# Patient Record
Sex: Male | Born: 1965 | Race: White | Hispanic: No | Marital: Married | Smoking: Current every day smoker
Health system: Southern US, Community
[De-identification: ages and names within clinical notes are randomized; demographics above are authoritative.]

## PROBLEM LIST (undated history)

## (undated) DIAGNOSIS — Z91018 Allergy to other foods: Secondary | ICD-10-CM

## (undated) DIAGNOSIS — K219 Gastro-esophageal reflux disease without esophagitis: Secondary | ICD-10-CM

## (undated) DIAGNOSIS — Z720 Tobacco use: Secondary | ICD-10-CM

## (undated) DIAGNOSIS — E785 Hyperlipidemia, unspecified: Secondary | ICD-10-CM

## (undated) DIAGNOSIS — E079 Disorder of thyroid, unspecified: Secondary | ICD-10-CM

## (undated) DIAGNOSIS — I10 Essential (primary) hypertension: Secondary | ICD-10-CM

## (undated) HISTORY — PX: ELBOW SURGERY: SHX618

## (undated) HISTORY — PX: CHOLECYSTECTOMY: SHX55

---

## 2000-06-02 ENCOUNTER — Ambulatory Visit (HOSPITAL_BASED_OUTPATIENT_CLINIC_OR_DEPARTMENT_OTHER): Admission: RE | Admit: 2000-06-02 | Discharge: 2000-06-02 | Payer: Self-pay | Admitting: Orthopedic Surgery

## 2005-01-03 ENCOUNTER — Emergency Department (HOSPITAL_COMMUNITY): Admission: EM | Admit: 2005-01-03 | Discharge: 2005-01-04 | Payer: Self-pay | Admitting: Emergency Medicine

## 2008-06-25 ENCOUNTER — Ambulatory Visit: Payer: Self-pay | Admitting: Cardiology

## 2010-10-29 NOTE — Op Note (Signed)
[place]  Patient:    Benjamin Anderson, Benjamin Anderson                      MRN: [id number] Proc. Date: 06/02/00 Adm. Date:  [id number] Disc. Date: [id number] Attending:  Alinda Deem                           Operative Report  PREOPERATIVE DIAGNOSIS:  Loose bodies of the left elbow.  POSTOPERATIVE DIAGNOSIS:  Loose bodies of the left elbow.  Chondromalacia of the olecranon.  PROCEDURE:  Left elbow arthroscopic removal of anterior loose body and debridement of chondromalacia from the olecranon near the radiocapetellar joint.  SURGEON:  Alinda Deem, M.D.  FIRST ASSISTANT:  Dorthula Matas, P.A.-C.  ANESTHESIA:  General endotracheal.  ESTIMATED BLOOD LOSS:  Minimal.  FLUID REPLACEMENT:  800 cc Crystalloid.  DRAINS PLACED:  None.  TOURNIQUET TIME:  ______ .  INDICATIONS FOR PROCEDURE:  A 45 year old man with mechanical catching and pain in his left elbow.  Plain radiographs show a loose body in the elbow anterior compartment.  He has failed conservative treatment and desires elective removal of the loose body that is a work-related injury.  DESCRIPTION OF PROCEDURE:  The patient identified by armband, taken to the operating room at [facility]. [facility]. Appropriate anesthetic monitor attached, general endotracheal anesthesia induced with the patient in the supine position.  He was then rolled into the right lateral decubitus position, tourniquet applied high to the left upper extremity.  He was placed in the elbow holder positioner.  The left upper extremity was then prepped and draped in the usual sterile fashion from the wrist to the elbow holder.  We began the procedure using an anterolateral approach to the elbow joint, and filling the joint with about 20 cc of 0.5% Marcaine with epinephrine solution. A #11 blade was then used to make a standard anterolateral portal, and the 4 mm Lindotek arthroscope  introduced from the anterolateral position.  We immediately identified the loose body, which was hiding in the medial guttering anteriorly; this was teased over the middle of the joint using an 18-gauge  needle from a medial approach.  A standard anteromedial portal was then made, allowing introduction of an arthroscopic grasper; which removed the 2-3 mm loose body without difficulty.  It was an osteochondral _____________. pearl with cartilage coating throughout.  The rest of the anterior compartment was in good condition.  The arthroscope was then repositioned into the posterior fossa using a posterolateral portal and a standard posterior portal.  The posterior olecranon fossa and the posterior aspect of the olecranon were in good condition.  Using a posterolateral portal, we then explored the humeroulnar joint, and did find some chondromalacia of the junction at the radiocapetellar joint (which was removed with a 2.9 mm ____________ posterolateral portal. It was more of a mid lateral portal.  Having accomplished this, the needle was washed out with normal saline solution.  The arthroscopic instrument was removed.  A dressing of Xeroform, 4 x dressing sponges, Webril and Ace wrap applied.  The patient was awakened and taken to the recovery room without difficulty. DD:  06/02/00 TD:  06/08/00 Job: [id number]

## 2015-01-01 ENCOUNTER — Encounter (HOSPITAL_COMMUNITY): Payer: Self-pay | Admitting: Emergency Medicine

## 2015-01-01 ENCOUNTER — Emergency Department (HOSPITAL_COMMUNITY)
Admission: EM | Admit: 2015-01-01 | Discharge: 2015-01-01 | Disposition: A | Discharge status: Home / Self Care | Payer: BLUE CROSS/BLUE SHIELD | Attending: Emergency Medicine | Admitting: Emergency Medicine

## 2015-01-01 ENCOUNTER — Emergency Department (HOSPITAL_COMMUNITY): Payer: BLUE CROSS/BLUE SHIELD

## 2015-01-01 DIAGNOSIS — R079 Chest pain, unspecified: Secondary | ICD-10-CM | POA: Diagnosis not present

## 2015-01-01 DIAGNOSIS — R110 Nausea: Secondary | ICD-10-CM | POA: Insufficient documentation

## 2015-01-01 DIAGNOSIS — K219 Gastro-esophageal reflux disease without esophagitis: Secondary | ICD-10-CM | POA: Diagnosis not present

## 2015-01-01 DIAGNOSIS — R61 Generalized hyperhidrosis: Secondary | ICD-10-CM | POA: Diagnosis not present

## 2015-01-01 DIAGNOSIS — Z720 Tobacco use: Secondary | ICD-10-CM | POA: Insufficient documentation

## 2015-01-01 DIAGNOSIS — I10 Essential (primary) hypertension: Secondary | ICD-10-CM | POA: Insufficient documentation

## 2015-01-01 DIAGNOSIS — E780 Pure hypercholesterolemia: Secondary | ICD-10-CM | POA: Diagnosis not present

## 2015-01-01 HISTORY — DX: Essential (primary) hypertension: I10

## 2015-01-01 LAB — COMPREHENSIVE METABOLIC PANEL
ALBUMIN: 4.2 g/dL (ref 3.5–5.0)
ALT: 34 U/L (ref 17–63)
AST: 26 U/L (ref 15–41)
Alkaline Phosphatase: 63 U/L (ref 38–126)
Anion gap: 8 (ref 5–15)
BUN: 14 mg/dL (ref 6–20)
CO2: 25 mmol/L (ref 22–32)
CREATININE: 1.21 mg/dL (ref 0.61–1.24)
Calcium: 9.3 mg/dL (ref 8.9–10.3)
Chloride: 105 mmol/L (ref 101–111)
GFR calc Af Amer: >60 mL/min (ref >60)
GLUCOSE: 112 mg/dL — AB (ref 65–99)
POTASSIUM: 3.7 mmol/L (ref 3.5–5.1)
Sodium: 138 mmol/L (ref 135–145)
TOTAL PROTEIN: 6.8 g/dL (ref 6.5–8.1)
Total Bilirubin: 1.1 mg/dL (ref 0.3–1.2)

## 2015-01-01 LAB — CBC
HEMATOCRIT: 42.1 % (ref 39.0–52.0)
HEMOGLOBIN: 14.6 g/dL (ref 13.0–17.0)
MCH: 31.7 pg (ref 26.0–34.0)
MCHC: 34.7 g/dL (ref 30.0–36.0)
MCV: 91.5 fL (ref 78.0–100.0)
PLATELETS: 217 10*3/uL (ref 150–400)
RBC: 4.6 MIL/uL (ref 4.22–5.81)
RDW: 13.1 % (ref 11.5–15.5)
WBC: 11.2 10*3/uL — AB (ref 4.0–10.5)

## 2015-01-01 LAB — TROPONIN I: Troponin I: <0.03 ng/mL (ref <0.031)

## 2015-01-01 LAB — PROTIME-INR
INR: 0.97 (ref 0.00–1.49)
Prothrombin Time: 13.1 seconds (ref 11.6–15.2)

## 2015-01-01 LAB — APTT: aPTT: 26 seconds (ref 24–37)

## 2015-01-01 MED ORDER — RANITIDINE HCL 150 MG PO TABS: 150.0000 mg | ORAL_TABLET | Freq: Two times a day (BID) | ORAL | Status: DC

## 2015-01-01 MED ORDER — ASPIRIN 81 MG PO CHEW
324.0000 mg | CHEWABLE_TABLET | Freq: Once | ORAL | Status: AC
  Administered 2015-01-01: 324 mg via ORAL
  Filled 2015-01-01: qty 4

## 2015-01-01 MED ORDER — NITROGLYCERIN 0.4 MG SL SUBL: 0.4000 mg | SUBLINGUAL_TABLET | SUBLINGUAL | Status: DC | PRN

## 2015-01-01 MED ORDER — GI COCKTAIL ~~LOC~~
30.0000 mL | Freq: Once | ORAL | Status: AC
  Administered 2015-01-01: 30 mL via ORAL
  Filled 2015-01-01: qty 30

## 2015-01-01 NOTE — ED Notes (Signed)
MD Miller at bedside. 

## 2015-01-01 NOTE — ED Provider Notes (Signed)
CSN: [id number]     Arrival date & time 01/01/15  1507 History   First MD Initiated Contact with Patient 01/01/15 1517     Chief Complaint  Patient presents with  . Chest Pain     (Consider location/radiation/quality/duration/timing/severity/associated sxs/prior Treatment) HPI Comments: Pt has hx of Htn, Hypercholesterolemia and Tobacco use - states that approximately 3 hours ago he developed a heavy sensation in his chest, with some radiation to the back, there was some increased sweating and nausea, this occurred while he was walking from one room to another at work. He has a history of a normal heart stress test several years ago for the same reason, he has never had a heart catheterization, he is not a diabetic, there is no family history of heart disease. The symptoms gradually improved though they did not go away by the time he got to the hospital. Since being at the hospital he has had a resurgence in the symptoms he points to the midsternal area, denies arm or neck symptoms, denies swelling of the legs, denies risk factors for pulmonary embolism. He does have acid reflux and states that this is distinct and different. Symptoms are mild at this time.  Patient is a 49 y.o. male presenting with chest pain. The history is provided by the patient.  Chest Pain   Past Medical History  Diagnosis Date  . Hypertension   . High cholesterol    Past Surgical History  Procedure Laterality Date  . Elbow surgery     History reviewed. No pertinent family history. History  Substance Use Topics  . Smoking status: Current Every Day Smoker  . Smokeless tobacco: Not on file  . Alcohol Use: No    Review of Systems  Cardiovascular: Positive for chest pain.  All other systems reviewed and are negative.     Allergies  Review of patient's allergies indicates no known allergies.  Home Medications   Prior to Admission medications   Medication Sig Start Date End Date Taking? Authorizing  Provider  atorvastatin (LIPITOR) 10 MG tablet Take 1 tablet by mouth at bedtime.  12/11/14  Yes Historical Provider, MD  calcium carbonate (TUMS - DOSED IN MG ELEMENTAL CALCIUM) 500 MG chewable tablet Chew 1 tablet by mouth daily as needed for indigestion or heartburn.   Yes Historical Provider, MD  metoprolol (LOPRESSOR) 50 MG tablet Take 25 mg by mouth at bedtime.  10/13/14  Yes Historical Provider, MD  ranitidine (ZANTAC) 150 MG tablet Take 1 tablet (150 mg total) by mouth 2 (two) times daily. 01/01/15   Eber Hong, MD   BP 116/83 mmHg  Pulse 71  Temp(Src) 98.1 F (36.7 C)  Resp 20  Ht 5\' 11"  (1.803 m)  Wt 245 lb (111.131 kg)  BMI 34.19 kg/m2  SpO2 99% Physical Exam  Constitutional: He appears well-developed and well-nourished. No distress.  HENT:  Head: Normocephalic and atraumatic.  Mouth/Throat: Oropharynx is clear and moist. No oropharyngeal exudate.  Eyes: Conjunctivae and EOM are normal. Pupils are equal, round, and reactive to light. Right eye exhibits no discharge. Left eye exhibits no discharge. No scleral icterus.  Neck: Normal range of motion. Neck supple. No JVD present. No thyromegaly present.  Cardiovascular: Normal rate, regular rhythm, normal heart sounds and intact distal pulses.  Exam reveals no gallop and no friction rub.   No murmur heard. Pulmonary/Chest: Effort normal and breath sounds normal. No respiratory distress. He has no wheezes. He has no rales.  Abdominal: Soft. Bowel sounds  are normal. He exhibits no distension and no mass. There is no tenderness.  Musculoskeletal: Normal range of motion. He exhibits no edema or tenderness.  Lymphadenopathy:    He has no cervical adenopathy.  Neurological: He is alert. Coordination normal.  Skin: Skin is warm and dry. No rash noted. No erythema.  Psychiatric: He has a normal mood and affect. His behavior is normal.  Nursing note and vitals reviewed.   ED Course  Procedures (including critical care time) Labs  Review Labs Reviewed  CBC - Abnormal; Notable for the following:    WBC 11.2 (*)    All other components within normal limits  COMPREHENSIVE METABOLIC PANEL - Abnormal; Notable for the following:    Glucose, Bld 112 (*)    All other components within normal limits  APTT  PROTIME-INR  TROPONIN I    Imaging Review Dg Chest Portable 1 View  01/01/2015   CLINICAL DATA:  Chest pain since launch, history of tobacco use  EXAM: PORTABLE CHEST - 1 VIEW  COMPARISON:  None.  FINDINGS: The heart size and mediastinal contours are within normal limits. Both lungs are clear. The visualized skeletal structures are unremarkable.  IMPRESSION: No active disease.   Electronically Signed   By: Alcide Clever M.D.   On: 01/01/2015 15:56    ED ECG REPORT  I personally interpreted this EKG   Date: 01/01/2015   Rate: 82  Rhythm: normal sinus rhythm  QRS Axis: normal  Intervals: normal  ST/T Wave abnormalities: normal  Conduction Disutrbances:none  Narrative Interpretation:   Old EKG Reviewed: none available   MDM   Final diagnoses:  Chest pain, unspecified chest pain type    At this time the patient has a normal exam with an EKG which is unremarkable, heart sounds are normal and no peripheral edema. His story is somewhat concerning as he does have some risk factors for heart disease.  His heart score is 4  The labs are normal - his pain totally resolved after GI cocktail- his VS are normal as was the ECG - the pt declines admission for r/o - we spoke at length about the indications for return, and the need for close f/u -  will add zantac.  Will also d/w cardiology to establish f/u.  D/w Cardiology - they will set up appt for early next week, pt in agreement.  I have personally viewed and interpreted the imaging and agree with radiologist interpretation. - no signs of infection / pneumothorax or abnormal mediastinum    Eber Hong, MD 01/01/15 [phone]

## 2015-01-01 NOTE — Discharge Instructions (Signed)
The cardiology office will call you to set up an appointment early next week - take a daily baby Aspirin until that time.  Please obtain all of your results from medical records or have your doctors office obtain the results - share them with your doctor - you should be seen at your doctors office in the next 2 days. Call today to arrange your follow up. Take the medications as prescribed. Please review all of the medicines and only take them if you do not have an allergy to them. Please be aware that if you are taking birth control pills, taking other prescriptions, ESPECIALLY ANTIBIOTICS may make the birth control ineffective - if this is the case, either do not engage in sexual activity or use alternative methods of birth control such as condoms until you have finished the medicine and your family doctor says it is OK to restart them. If you are on a blood thinner such as COUMADIN, be aware that any other medicine that you take may cause the coumadin to either work too much, or not enough - you should have your coumadin level rechecked in next 7 days if this is the case.  ?  It is also a possibility that you have an allergic reaction to any of the medicines that you have been prescribed - Everybody reacts differently to medications and while MOST people have no trouble with most medicines, you may have a reaction such as nausea, vomiting, rash, swelling, shortness of breath. If this is the case, please stop taking the medicine immediately and contact your physician.  ?  You should return to the ER if you develop severe or worsening symptoms.

## 2015-01-01 NOTE — ED Notes (Signed)
Central cp radiating to back with n/sweating since noon today.

## 2015-01-06 ENCOUNTER — Encounter: Payer: BLUE CROSS/BLUE SHIELD | Admitting: Cardiology

## 2015-01-19 ENCOUNTER — Emergency Department (HOSPITAL_COMMUNITY): Payer: BLUE CROSS/BLUE SHIELD

## 2015-01-19 ENCOUNTER — Encounter (HOSPITAL_COMMUNITY): Payer: Self-pay

## 2015-01-19 ENCOUNTER — Other Ambulatory Visit: Payer: Self-pay

## 2015-01-19 ENCOUNTER — Observation Stay (HOSPITAL_COMMUNITY)
Admission: EM | Admit: 2015-01-19 | Discharge: 2015-01-20 | Disposition: A | Discharge status: Home / Self Care | Payer: BLUE CROSS/BLUE SHIELD | Attending: Interventional Cardiology | Admitting: Interventional Cardiology

## 2015-01-19 DIAGNOSIS — E780 Pure hypercholesterolemia: Secondary | ICD-10-CM | POA: Insufficient documentation

## 2015-01-19 DIAGNOSIS — I200 Unstable angina: Secondary | ICD-10-CM | POA: Diagnosis present

## 2015-01-19 DIAGNOSIS — K219 Gastro-esophageal reflux disease without esophagitis: Secondary | ICD-10-CM | POA: Insufficient documentation

## 2015-01-19 DIAGNOSIS — E785 Hyperlipidemia, unspecified: Secondary | ICD-10-CM | POA: Diagnosis not present

## 2015-01-19 DIAGNOSIS — Z79899 Other long term (current) drug therapy: Secondary | ICD-10-CM | POA: Diagnosis not present

## 2015-01-19 DIAGNOSIS — Z8249 Family history of ischemic heart disease and other diseases of the circulatory system: Secondary | ICD-10-CM | POA: Diagnosis not present

## 2015-01-19 DIAGNOSIS — R0789 Other chest pain: Secondary | ICD-10-CM | POA: Diagnosis not present

## 2015-01-19 DIAGNOSIS — Z720 Tobacco use: Secondary | ICD-10-CM

## 2015-01-19 DIAGNOSIS — I10 Essential (primary) hypertension: Secondary | ICD-10-CM

## 2015-01-19 DIAGNOSIS — R079 Chest pain, unspecified: Secondary | ICD-10-CM | POA: Diagnosis present

## 2015-01-19 DIAGNOSIS — F17200 Nicotine dependence, unspecified, uncomplicated: Secondary | ICD-10-CM | POA: Insufficient documentation

## 2015-01-19 DIAGNOSIS — R21 Rash and other nonspecific skin eruption: Secondary | ICD-10-CM | POA: Insufficient documentation

## 2015-01-19 HISTORY — DX: Hyperlipidemia, unspecified: E78.5

## 2015-01-19 HISTORY — DX: Tobacco use: Z72.0

## 2015-01-19 HISTORY — DX: Gastro-esophageal reflux disease without esophagitis: K21.9

## 2015-01-19 LAB — COMPREHENSIVE METABOLIC PANEL WITH GFR
ALT: 24 U/L (ref 17–63)
AST: 22 U/L (ref 15–41)
Albumin: 3.8 g/dL (ref 3.5–5.0)
Alkaline Phosphatase: 61 U/L (ref 38–126)
Anion gap: 8 (ref 5–15)
BUN: 8 mg/dL (ref 6–20)
CO2: 23 mmol/L (ref 22–32)
Calcium: 9.3 mg/dL (ref 8.9–10.3)
Chloride: 108 mmol/L (ref 101–111)
Creatinine, Ser: 1.07 mg/dL (ref 0.61–1.24)
GFR calc Af Amer: >60 mL/min
GFR calc non Af Amer: >60 mL/min
Glucose, Bld: 113 mg/dL — ABNORMAL HIGH (ref 65–99)
Potassium: 4.1 mmol/L (ref 3.5–5.1)
Sodium: 139 mmol/L (ref 135–145)
Total Bilirubin: 0.6 mg/dL (ref 0.3–1.2)
Total Protein: 6.1 g/dL — ABNORMAL LOW (ref 6.5–8.1)

## 2015-01-19 LAB — CBC
HCT: 43.4 % (ref 39.0–52.0)
Hemoglobin: 14.9 g/dL (ref 13.0–17.0)
MCH: 32.3 pg (ref 26.0–34.0)
MCHC: 34.3 g/dL (ref 30.0–36.0)
MCV: 93.9 fL (ref 78.0–100.0)
PLATELETS: 183 10*3/uL (ref 150–400)
RBC: 4.62 MIL/uL (ref 4.22–5.81)
RDW: 13.6 % (ref 11.5–15.5)
WBC: 9.6 10*3/uL (ref 4.0–10.5)

## 2015-01-19 LAB — PROTIME-INR
INR: 1.02 (ref 0.00–1.49)
Prothrombin Time: 13.6 seconds (ref 11.6–15.2)

## 2015-01-19 LAB — I-STAT TROPONIN, ED
Troponin i, poc: 0 ng/mL (ref 0.00–0.08)
Troponin i, poc: 0 ng/mL (ref 0.00–0.08)

## 2015-01-19 LAB — APTT: aPTT: 27 s (ref 24–37)

## 2015-01-19 LAB — TROPONIN I: Troponin I: <0.03 ng/mL (ref <0.031)

## 2015-01-19 MED ORDER — METOPROLOL TARTRATE 25 MG PO TABS
25.0000 mg | ORAL_TABLET | Freq: Every day | ORAL | Status: DC
  Administered 2015-01-19: 21:00:00 25 mg via ORAL
  Filled 2015-01-19: qty 1

## 2015-01-19 MED ORDER — SODIUM CHLORIDE 0.9 % IV SOLN
1000.0000 mL | INTRAVENOUS | Status: DC
  Administered 2015-01-19: 1000 mL via INTRAVENOUS

## 2015-01-19 MED ORDER — SODIUM CHLORIDE 0.9 % IJ SOLN: 3.0000 mL | INTRAMUSCULAR | Status: DC | PRN

## 2015-01-19 MED ORDER — ATORVASTATIN CALCIUM 10 MG PO TABS: 10.0000 mg | ORAL_TABLET | Freq: Every day | ORAL | Status: DC

## 2015-01-19 MED ORDER — NITROGLYCERIN 2 % TD OINT
1.0000 [in_us] | TOPICAL_OINTMENT | Freq: Once | TRANSDERMAL | Status: AC
  Administered 2015-01-19: 1 [in_us] via TOPICAL
  Filled 2015-01-19: qty 1

## 2015-01-19 MED ORDER — DIPHENHYDRAMINE HCL 25 MG PO CAPS
25.0000 mg | ORAL_CAPSULE | ORAL | Status: AC
  Administered 2015-01-19: 25 mg via ORAL
  Filled 2015-01-19: qty 1

## 2015-01-19 MED ORDER — SODIUM CHLORIDE 0.9 % IV SOLN: 250.0000 mL | INTRAVENOUS | Status: DC | PRN

## 2015-01-19 MED ORDER — DIPHENHYDRAMINE HCL 25 MG PO CAPS
25.0000 mg | ORAL_CAPSULE | Freq: Four times a day (QID) | ORAL | Status: DC | PRN
  Administered 2015-01-19 – 2015-01-20 (×4): 25 mg via ORAL
  Filled 2015-01-19 (×4): qty 1

## 2015-01-19 MED ORDER — ASPIRIN EC 81 MG PO TBEC
81.0000 mg | DELAYED_RELEASE_TABLET | Freq: Every day | ORAL | Status: DC
  Administered 2015-01-20: 11:00:00 81 mg via ORAL
  Filled 2015-01-19: qty 1

## 2015-01-19 MED ORDER — SODIUM CHLORIDE 0.9 % IJ SOLN
3.0000 mL | Freq: Two times a day (BID) | INTRAMUSCULAR | Status: DC
  Administered 2015-01-20: 3 mL via INTRAVENOUS

## 2015-01-19 MED ORDER — ASPIRIN 81 MG PO CHEW: 81.0000 mg | CHEWABLE_TABLET | ORAL | Status: DC

## 2015-01-19 MED ORDER — SODIUM CHLORIDE 0.9 % WEIGHT BASED INFUSION: 3.0000 mL/kg/h | INTRAVENOUS | Status: DC

## 2015-01-19 MED ORDER — HEPARIN (PORCINE) IN NACL 100-0.45 UNIT/ML-% IJ SOLN
1500.0000 [IU]/h | INTRAMUSCULAR | Status: DC
  Administered 2015-01-19: 1200 [IU]/h via INTRAVENOUS
  Filled 2015-01-19 (×2): qty 250

## 2015-01-19 MED ORDER — ONDANSETRON HCL 4 MG/2ML IJ SOLN
4.0000 mg | Freq: Four times a day (QID) | INTRAMUSCULAR | Status: DC | PRN
  Administered 2015-01-19: 22:00:00 4 mg via INTRAVENOUS

## 2015-01-19 MED ORDER — HEPARIN BOLUS VIA INFUSION
4000.0000 [IU] | Freq: Once | INTRAVENOUS | Status: AC
  Administered 2015-01-19: 4000 [IU] via INTRAVENOUS
  Filled 2015-01-19: qty 4000

## 2015-01-19 MED ORDER — ONDANSETRON HCL 4 MG/2ML IJ SOLN
INTRAMUSCULAR | Status: AC
  Filled 2015-01-19: qty 2

## 2015-01-19 MED ORDER — ACETAMINOPHEN 325 MG PO TABS
650.0000 mg | ORAL_TABLET | Freq: Four times a day (QID) | ORAL | Status: DC | PRN
  Administered 2015-01-19: 21:00:00 650 mg via ORAL
  Filled 2015-01-19: qty 2

## 2015-01-19 MED ORDER — SODIUM CHLORIDE 0.9 % IJ SOLN: 3.0000 mL | Freq: Two times a day (BID) | INTRAMUSCULAR | Status: DC

## 2015-01-19 MED ORDER — SODIUM CHLORIDE 0.9 % WEIGHT BASED INFUSION
1.0000 mL/kg/h | INTRAVENOUS | Status: DC
  Administered 2015-01-20: 07:00:00 1 mL/kg/h via INTRAVENOUS

## 2015-01-19 MED ORDER — PANTOPRAZOLE SODIUM 40 MG PO TBEC
40.0000 mg | DELAYED_RELEASE_TABLET | Freq: Every day | ORAL | Status: DC
  Administered 2015-01-20: 40 mg via ORAL
  Filled 2015-01-19: qty 1

## 2015-01-19 MED ORDER — ASPIRIN 81 MG PO CHEW: 324.0000 mg | CHEWABLE_TABLET | Freq: Once | ORAL | Status: DC

## 2015-01-19 NOTE — H&P (Signed)
Patient ID: LARWENCE TU MRN: [id number] DOB/AGE: 47-Jan-1967 47 y.o.  Admit date: 01/19/2015 Referring PhysicianJon Lynelle Doctor, MD Primary PhysicianNo primary care provider on file. Primary Cardiologist: New Reason for Consultation: chest pain  HPI: Mr. Calvillo is a 49 yo male with PMHx of HTN, HLD, GERD and tobacco abuse who presents to the ED on 01/19/15 with complaint of chest pain. Patient developed acute onset of left sided chest pain this morning at work as he was sitting at his desk. Pain was severe, stabbing, 10/10 with radiation to his back and left jaw and associated with diaphoresis, nausea and hives. He took 4 aspirin with resolution of his chest pain after 3-4 minutes. Pain was worse with deep breath. These episodes tend to happen every other day and last for a few minutes at a time, but this episode was the most severe. He admits to chronic DOE, but denies previous cardiac history, orthopnea or lower extremity swelling. He does admit to a family history of cardiac disease. Patient has 31 pack year history, denies alcohol or illicit drug use. Patient denies history of anxiety. He has never had an echo or cardiac cath.   Patient was recently seen in the ED on 01/01/15 with similar complaints of chest pain that resolved after a GI cocktail. EKG and troponin were normal at that time. Patient was discharged from the ED on Zantac and with follow up with Cardiology; however, patient did not follow up.   Review of systems complete and found to be negative unless listed above   Past Medical History  Diagnosis Date  . Hypertension   . High cholesterol   . Acid reflux     History reviewed. No pertinent family history.  History   Social History  . Marital Status: Married    Spouse Name: N/A  . Number of Children: N/A  . Years of Education: N/A   Occupational History  . Not on file.   Social History Main Topics  . Smoking status: Current Every Day Smoker  . Smokeless tobacco: Not on  file  . Alcohol Use: No  . Drug Use: No  . Sexual Activity: Not on file   Other Topics Concern  . Not on file   Social History Narrative    Past Surgical History  Procedure Laterality Date  . Elbow surgery       Physical Exam: Filed Vitals:   01/19/15 1330 01/19/15 1400 01/19/15 1430 01/19/15 1445  BP: 112/78 120/81 108/76 131/77  Pulse: 56 55 59 65  Temp:      TempSrc:      Resp: Height:      Weight:      SpO2: 98% 98% 99% 100%   General: Vital signs reviewed.  Patient is well-developed and well-nourished, in no acute distress and cooperative with exam.  Cardiovascular: RRR, S1 normal, S2 normal, no murmurs, gallops, or rubs. Pain not reproducible with palpation.  Pulmonary/Chest: Clear to auscultation bilaterally, no wheezes, rales, or rhonchi. Abdominal: Soft, non-tender, non-distended, BS + Extremities: No lower extremity edema bilaterally, pulses symmetric and intact bilaterally. Neurological: A&O x3 Skin: Raised wheals on anterior superior chest and shoulders. Psychiatric: Normal mood and affect. speech and behavior is normal. Cognition and memory are normal.   Labs:   Lab Results  Component Value Date   WBC 9.6 01/19/2015   HGB 14.9 01/19/2015   HCT 43.4 01/19/2015   MCV 93.9 01/19/2015   PLT 183 01/19/2015  Recent Labs Lab 01/19/15 1212  NA 139  K 4.1  CL 108  CO2 23  BUN 8  CREATININE 1.07  CALCIUM 9.3  PROT 6.1*  BILITOT 0.6  ALKPHOS 61  ALT 24  AST 22  GLUCOSE 113*   EKG: NSR  ASSESSMENT AND PLAN:  Active Problems:   * No active hospital problems. *  Unstable Angina: Chest pain sounds typical in nature and is concerning for unstable angina given frequent occurences at rest. Heart score is 5 with significant risk factors of tobacco use, age, hyperlipidemia, hypertension, and family history. EKG does not show acute ischemia and troponin negative. CXR negative for acute cardiopulmonary disease. Given typical symptoms, risk  factors, and worsening symptoms over the past 2 weeks, we recommend admission for cardiac catheterization tomorrow morning. Patient took ASA 325 mg this morning.  -Admit to telemetry -Heparin per pharmacy -NPO at midnight -Cardiac cath tomorrow -Trend troponins -Repeat EKG tomorrow morning  -Continue ASA/metoprolol/statin  HTN: Well controlled at 124/80 on metoprolol 25 mg QHS at home.  -Continue metoprolol 25 mg QHS   Hyperlipidemia: Unknown lipid panel results, but patient is on atorvastatin 10 mg daily at home.  -Continue atorvastatin 10 mg daily, may need increase -Repeat lipid panel as outpatient  Signed: Jill Alexanders, DO PGY-2 Internal Medicine Resident Pager # [phone] 01/19/2015 3:34 PM   I have examined the patient and reviewed assessment and plan and discussed with patient.  Agree with above as stated.  Patient with chest discomfort which is increasing in frequency and severity. He has multiple risk factors for CAD. We discussed ischemia evaluation with both stress testing and cardiac catheterization. Given that his symptoms are getting more frequent and they are somewhat typical in nature, we'll plan on cardiac catheterization. The risks and benefits of the procedure were explained to the patient and his family. All questions were answered. Start IV heparin tonight. Continue rule out for MI. He'll be on the cath Schedule for tomorrow.  The patient understands that risks include but are not limited to stroke (1 in 1000), death (1 in 1000), kidney failure [usually temporary] (1 in 500), bleeding (1 in 200), allergic reaction [possibly serious] (1 in 200), and agrees to proceed.    Paighton Godette S.

## 2015-01-19 NOTE — ED Notes (Signed)
Phlebotomy at the bedside  

## 2015-01-19 NOTE — ED Provider Notes (Signed)
CSN: [id number]     Arrival date & time 01/19/15  1141 History   First MD Initiated Contact with Patient 01/19/15 1141     Chief Complaint  Patient presents with  . Chest Pain   HPI Patient presents to the emergency room for evaluation of chest pain. The patient has been having pain off and on for the last couple of weeks. The episodes last maybe 5-10 minutes in duration. The patient notices a discomfort in the center of his chest and sometimes it radiates to his back and his jaw. Today he was at work today when he had another episode that was severe. Nauseated and diaphoretic. He did not have any shortness of breath. He went to the nurse at his place of work and was sent to the emergency department. Patient does not have a history of heart disease. He does have history of hypertension and hypercholesterolemia and he smokes. He was actually seen in the emergency room on July 21. Admission was discussed with the patient did not want to be admitted to the hospital.  Arrangements were made for outpatient cardiology follow-up. The patient did not go to that appointment. Past Medical History  Diagnosis Date  . Hypertension   . High cholesterol   . Acid reflux    Past Surgical History  Procedure Laterality Date  . Elbow surgery     History reviewed. No pertinent family history. History  Substance Use Topics  . Smoking status: Current Every Day Smoker  . Smokeless tobacco: Not on file  . Alcohol Use: No    Review of Systems  All other systems reviewed and are negative.     Allergies  Other  Home Medications   Prior to Admission medications   Medication Sig Start Date End Date Taking? Authorizing Provider  atorvastatin (LIPITOR) 10 MG tablet Take 1 tablet by mouth at bedtime.  12/11/14  Yes Historical Provider, MD  calcium carbonate (TUMS - DOSED IN MG ELEMENTAL CALCIUM) 500 MG chewable tablet Chew 1 tablet by mouth daily as needed for indigestion or heartburn.   Yes Historical  Provider, MD  metoprolol (LOPRESSOR) 50 MG tablet Take 25 mg by mouth at bedtime.  10/13/14  Yes Historical Provider, MD  pantoprazole (PROTONIX) 40 MG tablet Take 40 mg by mouth daily.   Yes Historical Provider, MD  ranitidine (ZANTAC) 150 MG tablet Take 1 tablet (150 mg total) by mouth 2 (two) times daily. Patient not taking: Reported on 01/19/2015 01/01/15   Eber Hong, MD   BP 125/80 mmHg  Pulse 59  Temp(Src) 98 F (36.7 C) (Oral)  Resp 13  Ht  (1.803 m)  Wt 243 lb (110.224 kg)  BMI 33.91 kg/m2  SpO2 99% Physical Exam  Constitutional: He appears well-developed and well-nourished. No distress.  HENT:  Head: Normocephalic and atraumatic.  Right Ear: External ear normal.  Left Ear: External ear normal.  Eyes: Conjunctivae are normal. Right eye exhibits no discharge. Left eye exhibits no discharge. No scleral icterus.  Neck: Neck supple. No tracheal deviation present.  Cardiovascular: Normal rate, regular rhythm and intact distal pulses.   Pulmonary/Chest: Effort normal and breath sounds normal. No stridor. No respiratory distress. He has no wheezes. He has no rales.  Abdominal: Soft. Bowel sounds are normal. He exhibits no distension. There is no tenderness. There is no rebound and no guarding.  Musculoskeletal: He exhibits no edema or tenderness.  Neurological: He is alert. He has normal strength. No cranial nerve deficit (no facial  droop, extraocular movements intact, no slurred speech) or sensory deficit. He exhibits normal muscle tone. He displays no seizure activity. Coordination normal.  Skin: Skin is warm and dry. No rash noted.  Psychiatric: He has a normal mood and affect.  Nursing note and vitals reviewed.   ED Course  Procedures (including critical care time) Labs Review Labs Reviewed  COMPREHENSIVE METABOLIC PANEL - Abnormal; Notable for the following:    Glucose, Bld 113 (*)    Total Protein 6.1 (*)    All other components within normal limits  APTT  CBC   PROTIME-INR  I-STAT TROPOININ, ED    Imaging Review Dg Chest 2 View  01/19/2015   CLINICAL DATA:  Chest pain  EXAM: CHEST  2 VIEW  COMPARISON:  01/01/2015  FINDINGS: Cardiomediastinal silhouette is unremarkable. No acute infiltrate or pleural effusion. No pulmonary edema. Bony thorax is unremarkable.  IMPRESSION: No active cardiopulmonary disease.   Electronically Signed   By: Natasha Mead M.D.   On: 01/19/2015 12:32     EKG Interpretation   Date/Time:  Monday January 19 2015 11:40:24 EDT Ventricular Rate:  64 PR Interval:  164 QRS Duration: 106 QT Interval:  412 QTC Calculation: 425 R Axis:   64 Text Interpretation:  Normal sinus rhythm Normal ECG No significant change  since last tracing Confirmed by Lilyrose Tanney  MD-J, Tysean [place] ([id number]) on 01/19/2015  12:12:20 PM     Medications  0.9 %  sodium chloride infusion (1,000 mLs Intravenous New Bag/Given 01/19/15 1228)  nitroGLYCERIN (NITROGLYN) 2 % ointment 1 inch (1 inch Topical Given 01/19/15 1223)    MDM   Final diagnoses:  Chest pain, unspecified chest pain type   Patient's EKG and cardiac enzymes are reassuring.  The patient's symptoms however are concerning for unstable angina. Heart score is 5.  Patient was supposed to follow-up with the cardiologist failed to make that appointment. I will consult with cardiology see if we can expedite further evaluation vs admit for observation, further testing.  Linwood Dibbles, MD 01/19/15 [phone]

## 2015-01-19 NOTE — ED Notes (Signed)
Cardiology at the bedside.

## 2015-01-19 NOTE — ED Notes (Signed)
Meal tray at the bedside

## 2015-01-19 NOTE — ED Notes (Signed)
Per EMS, Patient is coming from [facility] in [place]. Patient has had a HX of Central chest pain for two weeks. Today, patient reports the central chest pain radiating to his back becoming increasingly worse with 10/10 chest pain. Patient was given 324 mg of Aspirin at facility with relief in chest and decrease of back pain to 2/10. Patient denies SOB, Dizziness, Lighteheadedness, or Nausea. Patient is alert and oriented x4 upon arrival. 20 Gauge in Left AC. 155 CBG, 126/54, 65 HR, 18 RR, 98% on 2 L. HX of HTN, High Cholesterol, and Acid Reflux.

## 2015-01-19 NOTE — Progress Notes (Signed)
ANTICOAGULATION CONSULT NOTE - Initial Consult  Pharmacy Consult for heparin Indication: chest pain/ACS  Allergies  Allergen Reactions  . Other Hives and Itching    tomatoe    Patient Measurements: Height:  (180.3 cm) Weight: 243 lb (110.224 kg) IBW/kg (Calculated) : 75.3 Heparin Dosing Weight: 99 kg  Vital Signs: Temp: 98 F (36.7 C) (08/08 1150) Temp Source: Oral (08/08 1150) BP: 122/76 mmHg (08/08 1700) Pulse Rate: 70 (08/08 1700)  Labs:  Recent Labs  01/19/15 1212  HGB 14.9  HCT 43.4  PLT 183  APTT 27  LABPROT 13.6  INR 1.02  CREATININE 1.07    Estimated Creatinine Clearance: 107.8 mL/min (by C-G formula based on Cr of 1.07).   Medical History: Past Medical History  Diagnosis Date  . Hypertension   . High cholesterol   . Acid reflux     Medications:  Scheduled:    Assessment: 49 yo male presents with chest pain that has been occuring on and off over the last couple of weeks. Was in ED 7/21 for chest pain.  Goal of Therapy:  Heparin level 0.3-0.7 units/ml Monitor platelets by anticoagulation protocol: Yes   Plan:  - Heparin bolus 4000 units, then heparin gtt 1200 units/hr - HL 0000 - Monitor daily CBC, HL, s/sx of bleeding  Annamary Carolin 01/19/2015,5:28 PM

## 2015-01-19 NOTE — ED Notes (Signed)
Paged Admitting provider.

## 2015-01-19 NOTE — Consult Note (Signed)
CARDIOLOGY CONSULT NOTE      Patient ID: Benjamin Anderson MRN: [id number] DOB/AGE: 1965/08/13 49 y.o.  Admit date: 01/19/2015 Referring PhysicianJon Lynelle Doctor, MD Primary PhysicianNo primary care provider on file. Primary Cardiologist: None Reason for Consultation: chest pain  HPI: [name] is a 49 yo male with PMHx of HTN, HLD, GERD and tobacco abuse who presents to the ED on 01/19/15 with complaint of chest pain. Patient developed acute onset of left sided chest pain this morning at work as he was sitting at his desk. Pain was severe, stabbing, 10/10 with radiation to his back and left jaw and associated with diaphoresis, nausea and hives. He took 4 aspirin with resolution of his chest pain after 3-4 minutes. Pain was worse with deep breath. These episodes tend to happen every other day and last for a few minutes at a time, but this episode was the most severe. He admits to chronic DOE, but denies previous cardiac history, orthopnea or lower extremity swelling. He does admit to a family history of cardiac disease. Patient has 31 pack year history, denies alcohol or illicit drug use. Patient denies history of anxiety. He has never had an echo or cardiac cath.   Patient was recently seen in the ED on 01/01/15 with similar complaints of chest pain that resolved after a GI cocktail. EKG and troponin were normal at that time. Patient was discharged from the ED on Zantac and with follow up with Cardiology; however, patient did not follow up.   Review of systems complete and found to be negative unless listed above   Past Medical History  Diagnosis Date  . Hypertension   . High cholesterol   . Acid reflux     History reviewed. No pertinent family history.  History   Social History  . Marital Status: Married    Spouse Name: N/A  . Number of Children: N/A  . Years of Education: N/A   Occupational History  . Not on file.   Social History Main Topics  . Smoking status: Current Every Day Smoker    . Smokeless tobacco: Not on file  . Alcohol Use: No  . Drug Use: No  . Sexual Activity: Not on file   Other Topics Concern  . Not on file   Social History Narrative    Past Surgical History  Procedure Laterality Date  . Elbow surgery       Physical Exam: Filed Vitals:   01/19/15 1330 01/19/15 1400 01/19/15 1430 01/19/15 1445  BP: 112/78 120/81 108/76 131/77  Pulse: 56 55 59 65  Temp:      TempSrc:      Resp: 14 12 15 18   Height:      Weight:      SpO2: 98% 98% 99% 100%   General: Vital signs reviewed.  Patient is well-developed and well-nourished, in no acute distress and cooperative with exam.  Cardiovascular: RRR, S1 normal, S2 normal, no murmurs, gallops, or rubs. Pain not reproducible with palpation.  Pulmonary/Chest: Clear to auscultation bilaterally, no wheezes, rales, or rhonchi. Abdominal: Soft, non-tender, non-distended, BS + Extremities: No lower extremity edema bilaterally, pulses symmetric and intact bilaterally. Neurological: A&O x3 Skin: Raised wheals on anterior superior chest and shoulders. Psychiatric: Normal mood and affect. speech and behavior is normal. Cognition and memory are normal.   Labs:   Lab Results  Component Value Date   WBC 9.6 01/19/2015   HGB 14.9 01/19/2015   HCT 43.4 01/19/2015   MCV 93.9 01/19/2015  PLT 183 01/19/2015     Recent Labs Lab 01/19/15 1212  NA 139  K 4.1  CL 108  CO2 23  BUN 8  CREATININE 1.07  CALCIUM 9.3  PROT 6.1*  BILITOT 0.6  ALKPHOS 61  ALT 24  AST 22  GLUCOSE 113*   EKG: NSR  ASSESSMENT AND PLAN:  Active Problems:   * No active hospital problems. *  Unstable Angina: Chest pain sounds typical in nature and is concerning for unstable angina given frequent occurences at rest. Heart score is 5 with significant risk factors of tobacco use, age, hyperlipidemia, hypertension, and family history. EKG does not show acute ischemia and troponin negative. CXR negative for acute cardiopulmonary disease.  Given typical symptoms, risk factors, and worsening symptoms over the past 2 weeks, we recommend admission for cardiac catheterization tomorrow morning. Patient took ASA 325 mg this morning.  -Admit to telemetry -Heparin per pharmacy -NPO at midnight -Cardiac cath tomorrow -Trend troponins -Repeat EKG tomorrow morning  -Continue ASA/metoprolol/statin  HTN: Well controlled at 124/80 on metoprolol 25 mg QHS at home.  -Continue metoprolol 25 mg QHS   Hyperlipidemia: Unknown lipid panel results, but patient is on atorvastatin 10 mg daily at home.  -Continue atorvastatin 10 mg daily, may need increase -Repeat lipid panel as outpatient  Signed: Jill Alexanders, DO PGY-2 Internal Medicine Resident Pager # [phone] 01/19/2015 3:34 PM   I have examined the patient and reviewed assessment and plan and discussed with patient.  Agree with above as stated.  Patient with chest discomfort which is increasing in frequency and severity. He has multiple risk factors for CAD. We discussed ischemia evaluation with both stress testing and cardiac catheterization. Given that his symptoms are getting more frequent and they are somewhat typical in nature, we'll plan on cardiac catheterization. The risks and benefits of the procedure were explained to the patient and his family. All questions were answered. Start IV heparin tonight. Continue rule out for MI. He'll be on the cath Schedule for tomorrow.  The patient understands that risks include but are not limited to stroke (1 in 1000), death (1 in 1000), kidney failure [usually temporary] (1 in 500), bleeding (1 in 200), allergic reaction [possibly serious] (1 in 200), and agrees to proceed.    [name]

## 2015-01-19 NOTE — ED Notes (Signed)
Patient returned from X-ray 

## 2015-01-20 ENCOUNTER — Encounter (HOSPITAL_COMMUNITY): Payer: Self-pay | Admitting: Physician Assistant

## 2015-01-20 ENCOUNTER — Encounter (HOSPITAL_COMMUNITY)
Admission: EM | Disposition: A | Discharge status: Home / Self Care | Payer: BLUE CROSS/BLUE SHIELD | Source: Home / Self Care | Attending: Emergency Medicine

## 2015-01-20 DIAGNOSIS — K219 Gastro-esophageal reflux disease without esophagitis: Secondary | ICD-10-CM | POA: Diagnosis not present

## 2015-01-20 DIAGNOSIS — I10 Essential (primary) hypertension: Secondary | ICD-10-CM

## 2015-01-20 DIAGNOSIS — I200 Unstable angina: Secondary | ICD-10-CM | POA: Diagnosis not present

## 2015-01-20 DIAGNOSIS — Z720 Tobacco use: Secondary | ICD-10-CM | POA: Diagnosis not present

## 2015-01-20 DIAGNOSIS — R0789 Other chest pain: Secondary | ICD-10-CM | POA: Diagnosis not present

## 2015-01-20 DIAGNOSIS — E785 Hyperlipidemia, unspecified: Secondary | ICD-10-CM

## 2015-01-20 HISTORY — PX: CARDIAC CATHETERIZATION: SHX172

## 2015-01-20 LAB — BASIC METABOLIC PANEL
Anion gap: 7 (ref 5–15)
BUN: 11 mg/dL (ref 6–20)
CALCIUM: 8.8 mg/dL — AB (ref 8.9–10.3)
CO2: 25 mmol/L (ref 22–32)
Chloride: 104 mmol/L (ref 101–111)
Creatinine, Ser: 1.19 mg/dL (ref 0.61–1.24)
GFR calc non Af Amer: >60 mL/min (ref >60)
Glucose, Bld: 108 mg/dL — ABNORMAL HIGH (ref 65–99)
Potassium: 4.2 mmol/L (ref 3.5–5.1)
SODIUM: 136 mmol/L (ref 135–145)

## 2015-01-20 LAB — CBC
HCT: 42.6 % (ref 39.0–52.0)
Hemoglobin: 14.6 g/dL (ref 13.0–17.0)
MCH: 31.9 pg (ref 26.0–34.0)
MCHC: 34.3 g/dL (ref 30.0–36.0)
MCV: 93 fL (ref 78.0–100.0)
PLATELETS: 196 10*3/uL (ref 150–400)
RBC: 4.58 MIL/uL (ref 4.22–5.81)
RDW: 13.6 % (ref 11.5–15.5)
WBC: 11.5 10*3/uL — ABNORMAL HIGH (ref 4.0–10.5)

## 2015-01-20 LAB — LIPID PANEL
CHOL/HDL RATIO: 6.2 ratio
Cholesterol: 161 mg/dL (ref 0–200)
HDL: 26 mg/dL — ABNORMAL LOW (ref >40)
LDL Cholesterol: 101 mg/dL — ABNORMAL HIGH (ref 0–99)
TRIGLYCERIDES: 169 mg/dL — AB (ref <150)
VLDL: 34 mg/dL (ref 0–40)

## 2015-01-20 LAB — HEPARIN LEVEL (UNFRACTIONATED)
HEPARIN UNFRACTIONATED: 0.28 [IU]/mL — AB (ref 0.30–0.70)
Heparin Unfractionated: 0.14 IU/mL — ABNORMAL LOW (ref 0.30–0.70)

## 2015-01-20 LAB — TROPONIN I: Troponin I: <0.03 ng/mL (ref <0.031)

## 2015-01-20 SURGERY — LEFT HEART CATH AND CORONARY ANGIOGRAPHY

## 2015-01-20 MED ORDER — ONDANSETRON HCL 4 MG/2ML IJ SOLN: 4.0000 mg | Freq: Four times a day (QID) | INTRAMUSCULAR | Status: DC | PRN

## 2015-01-20 MED ORDER — METOPROLOL SUCCINATE ER 25 MG PO TB24: 25.0000 mg | ORAL_TABLET | Freq: Every day | ORAL | Status: AC

## 2015-01-20 MED ORDER — IOHEXOL 350 MG/ML SOLN
INTRAVENOUS | Status: DC | PRN
  Administered 2015-01-20: 40 mL via INTRA_ARTERIAL

## 2015-01-20 MED ORDER — LIDOCAINE HCL (PF) 1 % IJ SOLN
INTRAMUSCULAR | Status: AC
  Filled 2015-01-20: qty 30

## 2015-01-20 MED ORDER — ACETAMINOPHEN 325 MG PO TABS: 650.0000 mg | ORAL_TABLET | ORAL | Status: DC | PRN

## 2015-01-20 MED ORDER — NITROGLYCERIN 1 MG/10 ML FOR IR/CATH LAB
INTRA_ARTERIAL | Status: AC
  Filled 2015-01-20: qty 10

## 2015-01-20 MED ORDER — METOPROLOL SUCCINATE ER 25 MG PO TB24: 25.0000 mg | ORAL_TABLET | Freq: Every day | ORAL | Status: DC

## 2015-01-20 MED ORDER — MIDAZOLAM HCL 2 MG/2ML IJ SOLN
INTRAMUSCULAR | Status: AC
  Filled 2015-01-20: qty 4

## 2015-01-20 MED ORDER — SODIUM CHLORIDE 0.9 % IV SOLN: 250.0000 mL | INTRAVENOUS | Status: DC | PRN

## 2015-01-20 MED ORDER — HEPARIN SODIUM (PORCINE) 1000 UNIT/ML IJ SOLN
INTRAMUSCULAR | Status: DC | PRN
  Administered 2015-01-20: 5000 [IU] via INTRAVENOUS

## 2015-01-20 MED ORDER — HEPARIN (PORCINE) IN NACL 2-0.9 UNIT/ML-% IJ SOLN
INTRAMUSCULAR | Status: AC
  Filled 2015-01-20: qty 1000

## 2015-01-20 MED ORDER — FENTANYL CITRATE (PF) 100 MCG/2ML IJ SOLN
INTRAMUSCULAR | Status: DC | PRN
  Administered 2015-01-20: 25 ug via INTRAVENOUS

## 2015-01-20 MED ORDER — MIDAZOLAM HCL 2 MG/2ML IJ SOLN
INTRAMUSCULAR | Status: DC | PRN
  Administered 2015-01-20: 2 mg via INTRAVENOUS

## 2015-01-20 MED ORDER — SODIUM CHLORIDE 0.9 % IJ SOLN: 3.0000 mL | INTRAMUSCULAR | Status: DC | PRN

## 2015-01-20 MED ORDER — FENTANYL CITRATE (PF) 100 MCG/2ML IJ SOLN
INTRAMUSCULAR | Status: AC
  Filled 2015-01-20: qty 4

## 2015-01-20 MED ORDER — SODIUM CHLORIDE 0.9 % IJ SOLN: 3.0000 mL | Freq: Two times a day (BID) | INTRAMUSCULAR | Status: DC

## 2015-01-20 MED ORDER — LIDOCAINE HCL (PF) 1 % IJ SOLN
INTRAMUSCULAR | Status: DC | PRN
  Administered 2015-01-20: 5 mL via INTRADERMAL

## 2015-01-20 MED ORDER — SODIUM CHLORIDE 0.9 % WEIGHT BASED INFUSION
1.0000 mL/kg/h | INTRAVENOUS | Status: AC
  Administered 2015-01-20: 1 mL/kg/h via INTRAVENOUS

## 2015-01-20 MED ORDER — VERAPAMIL HCL 2.5 MG/ML IV SOLN
INTRAVENOUS | Status: DC | PRN
  Administered 2015-01-20: 14:00:00 via INTRA_ARTERIAL

## 2015-01-20 MED ORDER — HEPARIN SODIUM (PORCINE) 1000 UNIT/ML IJ SOLN
INTRAMUSCULAR | Status: AC
  Filled 2015-01-20: qty 1

## 2015-01-20 MED ORDER — VERAPAMIL HCL 2.5 MG/ML IV SOLN
INTRAVENOUS | Status: AC
  Filled 2015-01-20: qty 2

## 2015-01-20 SURGICAL SUPPLY — 14 items
CATH INFINITI 5 FR JL3.5 (CATHETERS) ×3 IMPLANT
CATH INFINITI 5FR ANG PIGTAIL (CATHETERS) ×3 IMPLANT
CATH INFINITI 5FR MULTPACK ANG (CATHETERS) IMPLANT
CATH INFINITI JR4 5F (CATHETERS) ×3 IMPLANT
DEVICE RAD COMP TR BAND LRG (VASCULAR PRODUCTS) ×3 IMPLANT
GLIDESHEATH SLEND SS 6F .021 (SHEATH) ×3 IMPLANT
KIT HEART LEFT (KITS) ×3 IMPLANT
PACK CARDIAC CATHETERIZATION (CUSTOM PROCEDURE TRAY) ×3 IMPLANT
SHEATH PINNACLE 5F 10CM (SHEATH) IMPLANT
SYR MEDRAD MARK V 150ML (SYRINGE) ×3 IMPLANT
TRANSDUCER W/STOPCOCK (MISCELLANEOUS) ×3 IMPLANT
TUBING CIL FLEX 10 FLL-RA (TUBING) ×3 IMPLANT
WIRE EMERALD 3MM-J .035X150CM (WIRE) IMPLANT
WIRE SAFE-T 1.5MM-J .035X260CM (WIRE) ×3 IMPLANT

## 2015-01-20 NOTE — Progress Notes (Signed)
ANTICOAGULATION CONSULT NOTE - Follow Up Consult  Pharmacy Consult for Heparin  Indication: chest pain/ACS  Allergies  Allergen Reactions  . Other Hives and Itching    tomatoe    Patient Measurements: Height:  (180.3 cm) Weight: 247 lb 12.8 oz (112.4 kg) IBW/kg (Calculated) : 75.3  Vital Signs: Temp: 97.6 F (36.4 C) (08/09 1149) Temp Source: Oral (08/09 1149) BP: 111/64 mmHg (08/09 1149) Pulse Rate: 59 (08/09 1149)  Labs:  Recent Labs  01/19/15 1212 01/19/15 2141 01/20/15 0041 01/20/15 0505 01/20/15 1002  HGB 14.9  --   --  14.6  --   HCT 43.4  --   --  42.6  --   PLT 183  --   --  196  --   APTT 27  --   --   --   --   LABPROT 13.6  --   --   --   --   INR 1.02  --   --   --   --   HEPARINUNFRC  --   --  0.14*  --  0.28*  CREATININE 1.07  --   --  1.19  --   TROPONINI  --  <0.03  --  <0.03  --     Estimated Creatinine Clearance: 97.8 mL/min (by C-G formula based on Cr of 1.19).   Assessment: 49 yo M started on heparin for CP.  Heparin level subtherapeutic but nearing goal.  Anticipate patient will be transferred to the cath lab in the next few minutes, therefor will not adjust rate.  Goal of Therapy:  Heparin level 0.3-0.7 units/ml Monitor platelets by anticoagulation protocol: Yes   Plan:  Continue heparin at 1500 units/hr Follow-up after cath  [facility], Pharm.D., BCPS Clinical Pharmacist Pager [phone] 01/20/2015 12:59 PM

## 2015-01-20 NOTE — Progress Notes (Signed)
Patient: Benjamin Anderson / Admit Date: 01/19/2015 / Date of Encounter: 01/20/2015, 6:57 AM   Subjective: No chest pain or SOB. Watched cath video. Doesn't have any questions.   Objective: Telemetry: NSR (SB upper 40s overnight, as low as 47) Physical Exam: Blood pressure 108/78, pulse 60, temperature 97.5 F (36.4 C), temperature source Oral, resp. rate 15, height 5' 11" (1.803 m), weight 247 lb 12.8 oz (112.4 kg), SpO2 97 %. General: Well developed, well nourished WM in no acute distress. Head: Normocephalic, atraumatic, sclera non-icteric, no xanthomas, nares are without discharge. Neck: Negative for carotid bruits. JVP not elevated. Lungs: Clear bilaterally to auscultation without wheezes, rales, or rhonchi. Breathing is unlabored. Heart: RRR S1 S2 without murmurs, rubs, or gallops.  Abdomen: Soft, non-tender, non-distended with normoactive bowel sounds. No rebound/guarding. Extremities: No clubbing or cyanosis. No edema. Distal pedal pulses are 2+ and equal bilaterally. Neuro: Alert and oriented X 3. Moves all extremities spontaneously. Psych:  Responds to questions appropriately with a normal affect.   Intake/Output Summary (Last 24 hours) at 01/20/15 0657 Last data filed at 01/20/15 0606  Gross per 24 hour  Intake 391.87 ml  Output    300 ml  Net  91.87 ml    Inpatient Medications:  . aspirin EC  81 mg Oral Daily  . atorvastatin  10 mg Oral QHS  . metoprolol  25 mg Oral QHS  . pantoprazole  40 mg Oral Daily  . sodium chloride  3 mL Intravenous Q12H  . sodium chloride  3 mL Intravenous Q12H   Infusions:  . sodium chloride Stopped (01/20/15 0600)  . sodium chloride 3 mL/kg/hr (01/20/15 0600)   Followed by  . sodium chloride    . heparin 1,500 Units/hr (01/20/15 0600)    Labs:  Recent Labs  01/19/15 1212 01/20/15 0505  NA 139 136  K 4.1 4.2  CL 108 104  CO2 23 25  GLUCOSE 113* 108*  BUN 8 11  CREATININE 1.07 1.19  CALCIUM 9.3 8.8*    Recent Labs  01/19/15 1212  AST 22  ALT 24  ALKPHOS 61  BILITOT 0.6  PROT 6.1*  ALBUMIN 3.8    Recent Labs  01/19/15 1212 01/20/15 0505  WBC 9.6 11.5*  HGB 14.9 14.6  HCT 43.4 42.6  MCV 93.9 93.0  PLT 183 196    Recent Labs  01/19/15 2141 01/20/15 0505  TROPONINI <0.03 <0.03     Radiology/Studies:  Dg Chest 2 View  01/19/2015   CLINICAL DATA:  Chest pain  EXAM: CHEST  2 VIEW  COMPARISON:  01/01/2015  FINDINGS: Cardiomediastinal silhouette is unremarkable. No acute infiltrate or pleural effusion. No pulmonary edema. Bony thorax is unremarkable.  IMPRESSION: No active cardiopulmonary disease.   Electronically Signed   By: Liviu  Pop M.D.   On: 01/19/2015 12:32   Dg Chest Portable 1 View  01/01/2015   CLINICAL DATA:  Chest pain since launch, history of tobacco use  EXAM: PORTABLE CHEST - 1 VIEW  COMPARISON:  None.  FINDINGS: The heart size and mediastinal contours are within normal limits. Both lungs are clear. The visualized skeletal structures are unremarkable.  IMPRESSION: No active disease.   Electronically Signed   By: Mark  Lukens M.D.   On: 01/01/2015 15:56     Assessment and Plan   1. Chest pain concerning for unstable angina - continue ASA, BB, statin, heparin. Plan cath today. Risks/benefits reviewed.   2. HTN - controlled on present regimen but not clear   why he only takes Lopressor once daily. Will change to Toprol for longer acting coverage.  3. Hyperlipidemia - depending on cath result may need to titrate statin. If CAD is present would further increase Lipitor.  4. Tobacco abuse - counseled regarding cessation.  Signed, Dayna Dunn PA-C Pager: [phone]   Patient seen and examined. Agree with assessment and plan. Pt has had a several week history of chest pain. Mild chest dullness this am; on heparin. Risk factors notable for a 39 yr h/o tobacco use, + FH and hyperlipidemia. The risks and benefits of a cardiac catheterization including, but not limited to, death,  stroke, MI, kidney damage and bleeding were discussed with the patient who indicates understanding and agrees to proceed. Plan cath later today.   Thomas A. Kelly, MD, FACC 01/20/2015 11:06 AM   

## 2015-01-20 NOTE — Progress Notes (Signed)
ANTICOAGULATION CONSULT NOTE - Follow Up Consult  Pharmacy Consult for Heparin  Indication: chest pain/ACS  Allergies  Allergen Reactions  . Other Hives and Itching    tomatoe    Patient Measurements: Height:  (180.3 cm) Weight: 247 lb 12.8 oz (112.4 kg) IBW/kg (Calculated) : 75.3  Vital Signs: Temp: 98 F (36.7 C) (08/09 0000) Temp Source: Oral (08/09 0000) BP: 99/45 mmHg (08/09 0000) Pulse Rate: 61 (08/09 0000)  Labs:  Recent Labs  01/19/15 1212 01/19/15 2141 01/20/15 0041  HGB 14.9  --   --   HCT 43.4  --   --   PLT 183  --   --   APTT 27  --   --   LABPROT 13.6  --   --   INR 1.02  --   --   HEPARINUNFRC  --   --  0.14*  CREATININE 1.07  --   --   TROPONINI  --  <0.03  --     Estimated Creatinine Clearance: 108.8 mL/min (by C-G formula based on Cr of 1.07).   Assessment: Initial heparin level is sub-therapeutic, no issues per RN.   Goal of Therapy:  Heparin level 0.3-0.7 units/ml Monitor platelets by anticoagulation protocol: Yes   Plan:  -Increase heparin to 1500 units/hr -0900 HL -Daily CBC/HL -Monitor for bleeding -Cath today  Abran Duke 01/20/2015,1:46 AM

## 2015-01-20 NOTE — Research (Signed)
[facility] Informed Consent   Subject Name: Benjamin Anderson  Subject met inclusion and exclusion criteria.  The informed consent form, study requirements and expectations were reviewed with the subject and questions and concerns were addressed prior to the signing of the consent form.  The subject verbalized understanding of the trail requirements.  The subject agreed to participate in the CADLAB trial and signed the informed consent.  The informed consent was obtained prior to performance of any protocol-specific procedures for the subject.  A copy of the signed informed consent was given to the subject and a copy was placed in the subject's medical record.  [name] 01/20/2015, 09:45

## 2015-01-20 NOTE — H&P (View-Only) (Signed)
Patient: Benjamin Anderson / Admit Date: 01/19/2015 / Date of Encounter: 01/20/2015, 6:57 AM   Subjective: No chest pain or SOB. Watched cath video. Doesn't have any questions.   Objective: Telemetry: NSR (SB upper 40s overnight, as low as 47) Physical Exam: Blood pressure 108/78, pulse 60, temperature 97.5 F (36.4 C), temperature source Oral, resp. rate 15, height 5\' 11"  (1.803 m), weight 247 lb 12.8 oz (112.4 kg), SpO2 97 %. General: Well developed, well nourished WM in no acute distress. Head: Normocephalic, atraumatic, sclera non-icteric, no xanthomas, nares are without discharge. Neck: Negative for carotid bruits. JVP not elevated. Lungs: Clear bilaterally to auscultation without wheezes, rales, or rhonchi. Breathing is unlabored. Heart: RRR S1 S2 without murmurs, rubs, or gallops.  Abdomen: Soft, non-tender, non-distended with normoactive bowel sounds. No rebound/guarding. Extremities: No clubbing or cyanosis. No edema. Distal pedal pulses are 2+ and equal bilaterally. Neuro: Alert and oriented X 3. Moves all extremities spontaneously. Psych:  Responds to questions appropriately with a normal affect.   Intake/Output Summary (Last 24 hours) at 01/20/15 0657 Last data filed at 01/20/15 0606  Gross per 24 hour  Intake 391.87 ml  Output    300 ml  Net  91.87 ml    Inpatient Medications:  . aspirin EC  81 mg Oral Daily  . atorvastatin  10 mg Oral QHS  . metoprolol  25 mg Oral QHS  . pantoprazole  40 mg Oral Daily  . sodium chloride  3 mL Intravenous Q12H  . sodium chloride  3 mL Intravenous Q12H   Infusions:  . sodium chloride Stopped (01/20/15 0600)  . sodium chloride 3 mL/kg/hr (01/20/15 0600)   Followed by  . sodium chloride    . heparin 1,500 Units/hr (01/20/15 0600)    Labs:  Recent Labs  01/19/15 1212 01/20/15 0505  NA 139 136  K 4.1 4.2  CL 108 104  CO2 23 25  GLUCOSE 113* 108*  BUN 8 11  CREATININE 1.07 1.19  CALCIUM 9.3 8.8*    Recent Labs  01/19/15 1212  AST 22  ALT 24  ALKPHOS 61  BILITOT 0.6  PROT 6.1*  ALBUMIN 3.8    Recent Labs  01/19/15 1212 01/20/15 0505  WBC 9.6 11.5*  HGB 14.9 14.6  HCT 43.4 42.6  MCV 93.9 93.0  PLT 183 196    Recent Labs  01/19/15 2141 01/20/15 0505  TROPONINI <0.03 <0.03     Radiology/Studies:  Dg Chest 2 View  01/19/2015   CLINICAL DATA:  Chest pain  EXAM: CHEST  2 VIEW  COMPARISON:  01/01/2015  FINDINGS: Cardiomediastinal silhouette is unremarkable. No acute infiltrate or pleural effusion. No pulmonary edema. Bony thorax is unremarkable.  IMPRESSION: No active cardiopulmonary disease.   Electronically Signed   By: Natasha Mead M.D.   On: 01/19/2015 12:32   Dg Chest Portable 1 View  01/01/2015   CLINICAL DATA:  Chest pain since launch, history of tobacco use  EXAM: PORTABLE CHEST - 1 VIEW  COMPARISON:  None.  FINDINGS: The heart size and mediastinal contours are within normal limits. Both lungs are clear. The visualized skeletal structures are unremarkable.  IMPRESSION: No active disease.   Electronically Signed   By: Alcide Clever M.D.   On: 01/01/2015 15:56     Assessment and Plan   1. Chest pain concerning for unstable angina - continue ASA, BB, statin, heparin. Plan cath today. Risks/benefits reviewed.   2. HTN - controlled on present regimen but not clear  why he only takes Lopressor once daily. Will change to Toprol for longer acting coverage.  3. Hyperlipidemia - depending on cath result may need to titrate statin. If CAD is present would further increase Lipitor.  4. Tobacco abuse - counseled regarding cessation.  Signed, Ronie Spies PA-C Pager: [phone]   Patient seen and examined. Agree with assessment and plan. Pt has had a several week history of chest pain. Mild chest dullness this am; on heparin. Risk factors notable for a 39 yr h/o tobacco use, + FH and hyperlipidemia. The risks and benefits of a cardiac catheterization including, but not limited to, death,  stroke, MI, kidney damage and bleeding were discussed with the patient who indicates understanding and agrees to proceed. Plan cath later today.   Lennette Bihari, MD, [facility] 01/20/2015 11:06 AM

## 2015-01-20 NOTE — Interval H&P Note (Signed)
Cath Lab Visit (complete for each Cath Lab visit)  Clinical Evaluation Leading to the Procedure:   ACS: Yes.    Non-ACS:    Anginal Classification: CCS IV  Anti-ischemic medical therapy: Minimal Therapy (1 class of medications)  Non-Invasive Test Results: No non-invasive testing performed  Prior CABG: No previous CABG   TIMI SCORE  Patient Information:  TIMI Score is 1  UA/NSTEMI and low-risk features (e.g., TIMI score  Revascularization of the presumed culprit artery    U (6)  Indication: 9; Score: 6    History and Physical Interval Note:  01/20/2015 1:50 PM  Benjamin Anderson  has presented today for surgery, with the diagnosis of unstable angina  The various methods of treatment have been discussed with the patient and family. After consideration of risks, benefits and other options for treatment, the patient has consented to  Procedure(s): Left Heart Cath and Coronary Angiography (N/A) as a surgical intervention .  The patient's history has been reviewed, patient examined, no change in status, stable for surgery.  I have reviewed the patient's chart and labs.  Questions were answered to the patient's satisfaction.     VARANASI,JAYADEEP S.

## 2015-01-20 NOTE — Discharge Instructions (Signed)
No lifting over 5 lbs for 1 week. No sexual activity for 1 week. Keep procedure site clean & dry. If you notice increased pain, swelling, bleeding or pus, call/return!  You may shower, but no soaking baths/hot tubs/pools for 1 week.    Please see primary care physician as soon as possible regarding diffuse skin rash. If you have acute onset of shortness of breath, skin breakdown, fever or altered mental status, you will need to seek medical attention sooner

## 2015-01-20 NOTE — Progress Notes (Addendum)
Called by nursing staff for maculopapular rash that has been worsening since Sunday. Has been ongoing for the past several days. Diffuse all over the body, but does not follow certain dermatome. Pruritic, not painful. Not shingles. Patient denies any recent change in medication, shampoo, nor in contact with any new chemicals. Unclear cause. Rash occurred before exposure to contrast today.  He denies any SOB. Although does look like allergy, but does not have any sign of airway compromise. Advise continue on benadryl and see his PCP Dr. Sherril Croon in [place] tomorrow. If sign of worsening SOB, skin breakdown, fever or AMS, he has been advised to seek medical attention sooner.  Ramond Dial PA Pager: [phone]

## 2015-01-20 NOTE — Discharge Summary (Signed)
Discharge Summary   Patient ID: WITTEN CERTAIN MRN: [id number], DOB/AGE: Jun 18, 1965 49 y.o. Admit date: 01/19/2015 D/C date:     01/20/2015  Primary Cardiologist: Dr. Eldridge Dace  Primary Discharge Diagnoses:  1. Chest pain, noncardiac, possibly r/t GERD 2. Hypertension 3. Hyperlipidemia 4. Tobacco abuse 5. GERD  Hospital Course: Mr. Goynes is a 49 y/o M with history of HTN, HLD, GERD, and tobacco abuse who presented to the ED on 01/19/15 with complaint of chest pain. He developed acute onset of left sided chest pain that morning at work as he was sitting at his desk. Pain was severe, stabbing, 10/10 with radiation to his back and left jaw and associated with diaphoresis, nausea and hives. He took 4 aspirin with resolution of his chest pain after 3-4 minutes. Pain was worse with deep breath. He reported these episodes tending to occur every other day and lasting for a few minutes at a time, but this episode was the most severe. He admitted to chronic relatively unchanged DOE. He denied previous cardiac history, orthopnea, lower extremity swelling, anxiety, or alcohol/illicit drug use. He does have a family history of CAD. He was recently seen in the ER 01/01/15 with similar complaints of chest pain that resolved after a GI cocktail. EKG and troponin were normal at that time. Patient was discharged from the ED on Zantac and with follow up with cardiology; however, patient did not follow up. He did not take Zantac but did start Protonix.  This admission, his EKG was nonacute and initial troponins were negative. CXR was nonacute. He was not tachypneic, tachycardic or hypoxic. However, symptoms were concerning for unstable angina thus he was admitted for further evaluation. He was placed on IV heparin. Troponins remained negative. Blood sugar was mildly elevated to a max of 113. LFTs were unremarkable. Blood pressure was controlled. He underwent cath for definitive eval today showing no significant CAD, LVEF  55-65%. His chest pain did not appear to be cardiac. Risk factor modification was recommended. He did report improvement in symptoms after he had begun Protonix recently - we will continue in case this discomfort represents GI etiology. He will continue statin (LDL 101). His Lopressor was changed to Toprol for better day-long coverage as he was only taking this once daily. Tobacco cessation was strongly advised. He was advised to f/u PCP for monitoring of borderline blood sugars. We offered him cardiology follow-up for post-cath check versus follow-up with PCP and he would like to follow up with primary care. He does mention that his PCP recently considered sending him to a GI physician which would be very reasonable. The patient did well post-cath. He lifts heavy yarn spools for work - have given him a work excuse to return 01/26/15 given radial cath. Dr. Eldridge Dace has seen and examined the patient today and feels he is stable for discharge.    Discharge Vitals: Blood pressure 117/86, pulse 65, temperature 97 F (36.1 C), temperature source Oral, resp. rate 16, height  (1.803 m), weight 247 lb 12.8 oz (112.4 kg), SpO2 98 %.  Labs: Lab Results  Component Value Date   WBC 11.5* 01/20/2015   HGB 14.6 01/20/2015   HCT 42.6 01/20/2015   MCV 93.0 01/20/2015   PLT 196 01/20/2015    Recent Labs Lab 01/19/15 1212 01/20/15 0505  NA 139 136  K 4.1 4.2  CL 108 104  CO2 23 25  BUN 8 11  CREATININE 1.07 1.19  CALCIUM 9.3 8.8*  PROT 6.1*  --  BILITOT 0.6  --   ALKPHOS 61  --   ALT 24  --   AST 22  --   GLUCOSE 113* 108*    Recent Labs  01/19/15 2141 01/20/15 0505  TROPONINI <0.03 <0.03   Lab Results  Component Value Date   CHOL 161 01/20/2015   HDL 26* 01/20/2015   LDLCALC 101* 01/20/2015   TRIG 169* 01/20/2015    Diagnostic Studies/Procedures   Dg Chest 2 View  01/19/2015   CLINICAL DATA:  Chest pain  EXAM: CHEST  2 VIEW  COMPARISON:  01/01/2015  FINDINGS: Cardiomediastinal  silhouette is unremarkable. No acute infiltrate or pleural effusion. No pulmonary edema. Bony thorax is unremarkable.  IMPRESSION: No active cardiopulmonary disease.   Electronically Signed   By: Natasha Mead M.D.   On: 01/19/2015 12:32   Dg Chest Portable 1 View  01/01/2015   CLINICAL DATA:  Chest pain since launch, history of tobacco use  EXAM: PORTABLE CHEST - 1 VIEW  COMPARISON:  None.  FINDINGS: The heart size and mediastinal contours are within normal limits. Both lungs are clear. The visualized skeletal structures are unremarkable.  IMPRESSION: No active disease.   Electronically Signed   By: Alcide Clever M.D.   On: 01/01/2015 15:56   Cardiac catheterization this admission, please see full report and above for summary.  Discharge Medications   Current Discharge Medication List    START taking these medications   Details  metoprolol succinate (TOPROL-XL) 25 MG 24 hr tablet Take 1 tablet (25 mg total) by mouth at bedtime. Qty: 30 tablet, Refills: 2      CONTINUE these medications which have NOT CHANGED   Details  atorvastatin (LIPITOR) 10 MG tablet Take 1 tablet by mouth at bedtime.     calcium carbonate (TUMS - DOSED IN MG ELEMENTAL CALCIUM) 500 MG chewable tablet Chew 1 tablet by mouth daily as needed for indigestion or heartburn.    pantoprazole (PROTONIX) 40 MG tablet Take 40 mg by mouth daily.      STOP taking these medications     metoprolol (LOPRESSOR) 50 MG tablet - was taking 25mg  QHS. Changed to Toprol for longer coverage.      ranitidine (ZANTAC) 150 MG tablet - not taking PTA         Disposition   The patient will be discharged in stable condition to home. Discharge Instructions    Diet - low sodium heart healthy    Complete by:  As directed      Increase activity slowly    Complete by:  As directed   No driving for 2 days. No lifting over 5 lbs for 1 week. No sexual activity for 1 week. You may return to work on 01/26/15. Keep procedure site clean & dry. If  you notice increased pain, swelling, bleeding or pus, call/return!  You may shower, but no soaking baths/hot tubs/pools for 1 week.  Now is absolutely the right time to quit smoking!          Follow-up Information    Follow up with Primary Care Provider.   Why:  Please schedule appointment with your primary care provider for post-hospital follow-up. Also, your blood sugar was borderline elevated in the hospital - make sure to get regular checkups with primary care doctor to follow this.      Follow up with Corky Crafts., MD.   Specialties:  Cardiology, Radiology, Interventional Cardiology   Why:  If you have any questions about your  hospital stay or cath site, do not hesitate to call. You do not need to follow up with cardiology unless needed.   Contact information:   [address] [place] [address] [phone]         Duration of Discharge Encounter: Greater than 30 minutes including physician and PA time.  Maeola Harman, Dunn PA-C 01/20/2015, 3:30 PM  I have examined the patient and reviewed assessment and plan and discussed with patient.  Agree with above as stated.  Noncardiac etiologies of chest pain should be investigated.  Agree with GI w/u.  Stressed the importance of RF modification to prevent any development of CAD.   Favor Hackler S.

## 2015-01-21 MED FILL — Nitroglycerin IV Soln 100 MCG/ML in D5W: INTRA_ARTERIAL | Qty: 10 | Status: AC

## 2016-12-19 ENCOUNTER — Encounter: Payer: Self-pay | Admitting: Gastroenterology

## 2017-02-09 ENCOUNTER — Ambulatory Visit: Payer: BLUE CROSS/BLUE SHIELD | Admitting: Nurse Practitioner

## 2017-05-26 ENCOUNTER — Emergency Department (HOSPITAL_COMMUNITY): Payer: BLUE CROSS/BLUE SHIELD

## 2017-05-26 ENCOUNTER — Emergency Department (HOSPITAL_COMMUNITY)
Admission: EM | Admit: 2017-05-26 | Discharge: 2017-05-27 | Disposition: A | Discharge status: Home / Self Care | Payer: BLUE CROSS/BLUE SHIELD | Attending: Emergency Medicine | Admitting: Emergency Medicine

## 2017-05-26 ENCOUNTER — Other Ambulatory Visit: Payer: Self-pay

## 2017-05-26 ENCOUNTER — Encounter (HOSPITAL_COMMUNITY): Payer: Self-pay | Admitting: *Deleted

## 2017-05-26 DIAGNOSIS — I10 Essential (primary) hypertension: Secondary | ICD-10-CM | POA: Diagnosis not present

## 2017-05-26 DIAGNOSIS — F17210 Nicotine dependence, cigarettes, uncomplicated: Secondary | ICD-10-CM | POA: Insufficient documentation

## 2017-05-26 DIAGNOSIS — R1011 Right upper quadrant pain: Secondary | ICD-10-CM | POA: Insufficient documentation

## 2017-05-26 DIAGNOSIS — Z79899 Other long term (current) drug therapy: Secondary | ICD-10-CM | POA: Diagnosis not present

## 2017-05-26 DIAGNOSIS — E079 Disorder of thyroid, unspecified: Secondary | ICD-10-CM | POA: Insufficient documentation

## 2017-05-26 HISTORY — DX: Allergy to other foods: Z91.018

## 2017-05-26 HISTORY — DX: Disorder of thyroid, unspecified: E07.9

## 2017-05-26 LAB — URINALYSIS, ROUTINE W REFLEX MICROSCOPIC
Bilirubin Urine: NEGATIVE
GLUCOSE, UA: NEGATIVE mg/dL
Hgb urine dipstick: NEGATIVE
Ketones, ur: NEGATIVE mg/dL
LEUKOCYTES UA: NEGATIVE
Nitrite: NEGATIVE
Protein, ur: NEGATIVE mg/dL
Specific Gravity, Urine: 1.017 (ref 1.005–1.030)
pH: 6 (ref 5.0–8.0)

## 2017-05-26 LAB — COMPREHENSIVE METABOLIC PANEL
ALT: 21 U/L (ref 17–63)
ANION GAP: 6 (ref 5–15)
AST: 18 U/L (ref 15–41)
Albumin: 4.4 g/dL (ref 3.5–5.0)
Alkaline Phosphatase: 70 U/L (ref 38–126)
BILIRUBIN TOTAL: 0.5 mg/dL (ref 0.3–1.2)
BUN: 9 mg/dL (ref 6–20)
CO2: 27 mmol/L (ref 22–32)
Calcium: 9.3 mg/dL (ref 8.9–10.3)
Chloride: 105 mmol/L (ref 101–111)
Creatinine, Ser: 0.96 mg/dL (ref 0.61–1.24)
GFR calc Af Amer: >60 mL/min (ref >60)
GFR calc non Af Amer: >60 mL/min (ref >60)
GLUCOSE: 85 mg/dL (ref 65–99)
POTASSIUM: 3.9 mmol/L (ref 3.5–5.1)
SODIUM: 138 mmol/L (ref 135–145)
TOTAL PROTEIN: 6.9 g/dL (ref 6.5–8.1)

## 2017-05-26 LAB — CBC WITH DIFFERENTIAL/PLATELET
BASOS ABS: 0.1 10*3/uL (ref 0.0–0.1)
BASOS PCT: 1 %
Eosinophils Absolute: 0.4 10*3/uL (ref 0.0–0.7)
Eosinophils Relative: 3 %
HCT: 45.5 % (ref 39.0–52.0)
Hemoglobin: 15.4 g/dL (ref 13.0–17.0)
Lymphocytes Relative: 24 %
Lymphs Abs: 2.6 10*3/uL (ref 0.7–4.0)
MCH: 31.9 pg (ref 26.0–34.0)
MCHC: 33.8 g/dL (ref 30.0–36.0)
MCV: 94.2 fL (ref 78.0–100.0)
Monocytes Absolute: 0.7 10*3/uL (ref 0.1–1.0)
Monocytes Relative: 6 %
NEUTROS ABS: 7.1 10*3/uL (ref 1.7–7.7)
Neutrophils Relative %: 66 %
Platelets: 214 10*3/uL (ref 150–400)
RBC: 4.83 MIL/uL (ref 4.22–5.81)
RDW: 12.7 % (ref 11.5–15.5)
WBC: 10.8 10*3/uL — ABNORMAL HIGH (ref 4.0–10.5)

## 2017-05-26 LAB — LIPASE, BLOOD: Lipase: 31 U/L (ref 11–51)

## 2017-05-26 MED ORDER — IOPAMIDOL (ISOVUE-300) INJECTION 61%
100.0000 mL | Freq: Once | INTRAVENOUS | Status: AC | PRN
Start: 2017-05-26 — End: 2017-05-26
  Administered 2017-05-26: 100 mL via INTRAVENOUS

## 2017-05-26 NOTE — ED Provider Notes (Signed)
[facility] EMERGENCY DEPARTMENT Provider Note   CSN: [id number] Arrival date & time: 05/26/17  2010     History   Chief Complaint Chief Complaint  Patient presents with  . Abdominal Pain    HPI Benjamin Anderson is a 51 y.o. male.  HPI  Pt was seen at 2105.  Per pt, c/o gradual onset and persistence of constant RUQ abd "knot" for the past 3 days. Describes his symptoms as "sharp" and "I can feel a knot there" when he touches the area. States the pain radiates into his back. Has not been associated with any other symptoms. Denies change in symptoms with eating. Denies N/V, no diarrhea, no fevers, no back pain, no rash, no CP/SOB, no black or blood in stools, no injury.    Past Medical History:  Diagnosis Date  . Acid reflux   . Allergy to alpha-gal   . Hyperlipidemia   . Hypertension   . Thyroid disease   . Tobacco abuse     Patient Active Problem List   Diagnosis Date Noted  . Essential hypertension 01/20/2015  . Tobacco abuse 01/20/2015  . Hyperlipidemia 01/20/2015  . Unstable angina (HCC) 01/19/2015    Past Surgical History:  Procedure Laterality Date  . CARDIAC CATHETERIZATION N/A 01/20/2015   Procedure: Left Heart Cath and Coronary Angiography;  Surgeon: Corky CraftsJayadeep S Varanasi, MD;  [facility] LAB;  Service: Cardiovascular;  Laterality: N/A;  . ELBOW SURGERY         Home Medications    Prior to Admission medications   Medication Sig Start Date End Date Taking? Authorizing Provider  atorvastatin (LIPITOR) 10 MG tablet Take 1 tablet by mouth at bedtime.  12/11/14  Yes [provider]  levothyroxine (SYNTHROID, LEVOTHROID) 25 MCG tablet Take 25 mcg by mouth daily.   Yes [provider]  metoprolol succinate (TOPROL-XL) 25 MG 24 hr tablet Take 1 tablet (25 mg total) by mouth at bedtime. 01/20/15  Yes Dunn, Dayna N, PA-C  pantoprazole (PROTONIX) 40 MG tablet Take 40 mg by mouth daily.   Yes [provider]    Family  History No family history on file.  Social History Social History   Tobacco Use  . Smoking status: Current Every Day Smoker    Packs/day: 1.00  . Smokeless tobacco: Never Used  Substance Use Topics  . Alcohol use: No  . Drug use: No     Allergies   Other   Review of Systems Review of Systems ROS: Statement: All systems negative except as marked or noted in the HPI; Constitutional: Negative for fever and chills. ; ; Eyes: Negative for eye pain, redness and discharge. ; ; ENMT: Negative for ear pain, hoarseness, nasal congestion, sinus pressure and sore throat. ; ; Cardiovascular: Negative for chest pain, palpitations, diaphoresis, dyspnea and peripheral edema. ; ; Respiratory: Negative for cough, wheezing and stridor. ; ; Gastrointestinal: +abd pain. Negative for nausea, vomiting, diarrhea, blood in stool, hematemesis, jaundice and rectal bleeding. . ; ; Genitourinary: Negative for dysuria, flank pain and hematuria. ; ; Musculoskeletal: Negative for back pain and neck pain. Negative for swelling and trauma.; ; Skin: Negative for pruritus, rash, abrasions, blisters, bruising and skin lesion.; ; Neuro: Negative for headache, lightheadedness and neck stiffness. Negative for weakness, altered level of consciousness, altered mental status, extremity weakness, paresthesias, involuntary movement, seizure and syncope.       Physical Exam Updated Vital Signs BP 139/90 (BP Location: Right Arm)   Pulse 82  Temp 97.7 F (36.5 C) (Oral)   Resp 18   Ht 5\' 11"  (1.803 m)   Wt 113.9 kg (251 lb)   SpO2 100%   BMI 35.01 kg/m   Physical Exam 2110: Physical examination:  Nursing notes reviewed; Vital signs and O2 SAT reviewed;  Constitutional: Well developed, Well nourished, Well hydrated, In no acute distress; Head:  Normocephalic, atraumatic; Eyes: EOMI, PERRL, No scleral icterus; ENMT: Mouth and pharynx normal, Mucous membranes moist; Neck: Supple, Full range of motion, No lymphadenopathy;  Cardiovascular: Regular rate and rhythm, No gallop; Respiratory: Breath sounds clear & equal bilaterally, No wheezes.  Speaking full sentences with ease, Normal respiratory effort/excursion; Chest: Nontender, Movement normal; Abdomen: Soft, +very mild RUQ tenderness to palp. No rebound or guarding. No rash. No palp nodules, no fluctuance. No open wounds. No deformity. Nondistended, Normal bowel sounds; Genitourinary: No CVA tenderness; Extremities: Pulses normal, No tenderness, No edema, No calf edema or asymmetry.; Neuro: AA&Ox3, Major CN grossly intact.  Speech clear. No gross focal motor or sensory deficits in extremities. Climbs on and off stretcher easily by himself. Gait steady..; Skin: Color normal, Warm, Dry.   ED Treatments / Results  Labs (all labs ordered are listed, but only abnormal results are displayed)   EKG  EKG Interpretation None       Radiology   Procedures Procedures (including critical care time)  Medications Ordered in ED Medications  iopamidol (ISOVUE-300) 61 % injection 100 mL (not administered)     Initial Impression / Assessment and Plan / ED Course  I have reviewed the triage vital signs and the nursing notes.  Pertinent labs & imaging results that were available during my care of the patient were reviewed by me and considered in my medical decision making (see chart for details).  MDM Reviewed: previous chart, nursing note and vitals Reviewed previous: labs Interpretation: labs, CT scan and x-ray   Results for orders placed or performed during the hospital encounter of 05/26/17  Lipase, blood  Result Value Ref Range   Lipase 31 11 - 51 U/L  Comprehensive metabolic panel  Result Value Ref Range   Sodium 138 135 - 145 mmol/L   Potassium 3.9 3.5 - 5.1 mmol/L   Chloride 105 101 - 111 mmol/L   CO2 27 22 - 32 mmol/L   Glucose, Bld 85 65 - 99 mg/dL   BUN 9 6 - 20 mg/dL   Creatinine, Ser 8.290.96 0.61 - 1.24 mg/dL   Calcium 9.3 8.9 - 56.210.3 mg/dL    Total Protein 6.9 6.5 - 8.1 g/dL   Albumin 4.4 3.5 - 5.0 g/dL   AST 18 15 - 41 U/L   ALT 21 17 - 63 U/L   Alkaline Phosphatase 70 38 - 126 U/L   Total Bilirubin 0.5 0.3 - 1.2 mg/dL   GFR calc non Af Amer >60 >60 mL/min   GFR calc Af Amer >60 >60 mL/min   Anion gap 6 5 - 15  Urinalysis, Routine w reflex microscopic  Result Value Ref Range   Color, Urine YELLOW YELLOW   APPearance CLEAR CLEAR   Specific Gravity, Urine 1.017 1.005 - 1.030   pH 6.0 5.0 - 8.0   Glucose, UA NEGATIVE NEGATIVE mg/dL   Hgb urine dipstick NEGATIVE NEGATIVE   Bilirubin Urine NEGATIVE NEGATIVE   Ketones, ur NEGATIVE NEGATIVE mg/dL   Protein, ur NEGATIVE NEGATIVE mg/dL   Nitrite NEGATIVE NEGATIVE   Leukocytes, UA NEGATIVE NEGATIVE  CBC with Differential/Platelet  Result Value Ref  Range   WBC 10.8 (H) 4.0 - 10.5 K/uL   RBC 4.83 4.22 - 5.81 MIL/uL   Hemoglobin 15.4 13.0 - 17.0 g/dL   HCT 16.1 09.6 - 04.5 %   MCV 94.2 78.0 - 100.0 fL   MCH 31.9 26.0 - 34.0 pg   MCHC 33.8 30.0 - 36.0 g/dL   RDW 40.9 81.1 - 91.4 %   Platelets 214 150 - 400 K/uL   Neutrophils Relative % 66 %   Neutro Abs 7.1 1.7 - 7.7 K/uL   Lymphocytes Relative 24 %   Lymphs Abs 2.6 0.7 - 4.0 K/uL   Monocytes Relative 6 %   Monocytes Absolute 0.7 0.1 - 1.0 K/uL   Eosinophils Relative 3 %   Eosinophils Absolute 0.4 0.0 - 0.7 K/uL   Basophils Relative 1 %   Basophils Absolute 0.1 0.0 - 0.1 K/uL    Dg Chest 2 View Result Date: 05/26/2017 CLINICAL DATA:  Initial evaluation for acute right-sided abdominal pain. EXAM: CHEST  2 VIEW COMPARISON:  Prior radiograph from 05/24/2017. FINDINGS: The cardiac and mediastinal silhouettes are stable in size and contour, and remain within normal limits. The lungs are normally inflated. No airspace consolidation, pleural effusion, or pulmonary edema is identified. There is no pneumothorax. No acute osseous abnormality identified. IMPRESSION: No active cardiopulmonary disease. Electronically Signed   By:  Rise Mu M.D.   On: 05/26/2017 22:50   Ct Abdomen Pelvis W Contrast Result Date: 05/26/2017 CLINICAL DATA:  Acute onset of right upper quadrant abdominal pain, radiating to back. Shortness of breath. EXAM: CT ABDOMEN AND PELVIS WITH CONTRAST TECHNIQUE: Multidetector CT imaging of the abdomen and pelvis was performed using the standard protocol following bolus administration of intravenous contrast. CONTRAST:  ISOVUE-300 IOPAMIDOL (ISOVUE-300) INJECTION 61% COMPARISON:  CT the abdomen and pelvis from 03/05/2015 FINDINGS: Lower chest: The visualized lung bases are grossly clear. The visualized portions of the mediastinum are unremarkable. Hepatobiliary: The liver is unremarkable in appearance. The gallbladder is unremarkable in appearance. The common bile duct remains normal in caliber. Pancreas: The pancreas is within normal limits. Spleen: The spleen is unremarkable in appearance. Adrenals/Urinary Tract: The adrenal glands are unremarkable in appearance. The kidneys are within normal limits. There is no evidence of hydronephrosis. No renal or ureteral stones are identified. No perinephric stranding is seen. Stomach/Bowel: The stomach is unremarkable in appearance. The small bowel is within normal limits. The appendix is normal in caliber, without evidence of appendicitis. The colon is unremarkable in appearance. Vascular/Lymphatic: Minimal calcification is noted at the distal abdominal aorta. The inferior vena cava is grossly unremarkable. No retroperitoneal lymphadenopathy is seen. No pelvic sidewall lymphadenopathy is identified. Reproductive: The bladder is mildly distended and grossly unremarkable. The prostate remains normal in size. Other: No additional soft tissue abnormalities are seen. Musculoskeletal: No acute osseous abnormalities are identified. The visualized musculature is unremarkable in appearance. IMPRESSION: No acute abnormality seen within the abdomen or pelvis.  Electronically Signed   By: Roanna Raider M.D.   On: 05/26/2017 23:24    2325:  Workup reassuring. Dx and testing d/w pt.  Questions answered.  Verb understanding, agreeable to d/c home with outpt f/u.    Final Clinical Impressions(s) / ED Diagnoses   Final diagnoses:  None    ED Discharge Orders    None       Samuel Jester, DO 05/29/17 1756

## 2017-05-26 NOTE — ED Triage Notes (Signed)
Pt reports a "knot" coming up on his ruq that radiates into his back. Pt states that the pain is worse in his back. Pt states at times he feels sob.

## 2017-05-26 NOTE — Discharge Instructions (Signed)
Eat a bland diet, avoiding greasy, fatty, fried foods, as well as spicy and acidic foods or beverages.  Avoid eating within 2 to 3 hours before going to bed or laying down.  Also avoid teas, colas, coffee, chocolate, pepermint and spearment. Take your usual prescriptions as previously directed.  Call your regular medical doctor on Monday to schedule a follow up appointment this week.  Return to the Emergency Department immediately if worsening.

## 2018-03-17 ENCOUNTER — Emergency Department (HOSPITAL_COMMUNITY)
Admission: EM | Admit: 2018-03-17 | Discharge: 2018-03-17 | Disposition: A | Discharge status: Home / Self Care | Payer: BLUE CROSS/BLUE SHIELD | Attending: Emergency Medicine | Admitting: Emergency Medicine

## 2018-03-17 ENCOUNTER — Encounter (HOSPITAL_COMMUNITY): Payer: Self-pay | Admitting: *Deleted

## 2018-03-17 ENCOUNTER — Other Ambulatory Visit: Payer: Self-pay

## 2018-03-17 DIAGNOSIS — I10 Essential (primary) hypertension: Secondary | ICD-10-CM | POA: Insufficient documentation

## 2018-03-17 DIAGNOSIS — Z203 Contact with and (suspected) exposure to rabies: Secondary | ICD-10-CM | POA: Diagnosis not present

## 2018-03-17 DIAGNOSIS — Z2914 Encounter for prophylactic rabies immune globin: Secondary | ICD-10-CM | POA: Insufficient documentation

## 2018-03-17 DIAGNOSIS — F17210 Nicotine dependence, cigarettes, uncomplicated: Secondary | ICD-10-CM | POA: Diagnosis not present

## 2018-03-17 DIAGNOSIS — Z23 Encounter for immunization: Secondary | ICD-10-CM | POA: Diagnosis not present

## 2018-03-17 MED ORDER — RABIES IMMUNE GLOBULIN 150 UNIT/ML IM INJ
20.0000 [IU]/kg | INJECTION | Freq: Once | INTRAMUSCULAR | Status: DC
  Filled 2018-03-17: qty 15

## 2018-03-17 MED ORDER — RABIES VACCINE, PCEC IM SUSR
1.0000 mL | Freq: Once | INTRAMUSCULAR | Status: AC
  Administered 2018-03-17: 1 mL via INTRAMUSCULAR
  Filled 2018-03-17: qty 1

## 2018-03-17 MED ORDER — RABIES IMMUNE GLOBULIN 150 UNIT/ML IM INJ
20.0000 [IU]/kg | INJECTION | Freq: Once | INTRAMUSCULAR | Status: AC
Start: 2018-03-17 — End: 2018-03-17
  Administered 2018-03-17: 2250 [IU] via INTRAMUSCULAR
  Filled 2018-03-17: qty 15

## 2018-03-17 NOTE — Discharge Instructions (Addendum)
Return to the [facility] urgent care for subsequent vaccines.

## 2018-03-17 NOTE — ED Provider Notes (Signed)
[facility] EMERGENCY DEPARTMENT Provider Note   CSN: [id number] Arrival date & time: 03/17/18  1042     History   Chief Complaint Chief Complaint  Patient presents with  . rABIES EXPOSURE    HPI Benjamin Anderson is a 52 y.o. male with past medical history of hypertension, thyroid disease, presenting to the emergency department with concern for possible rabies exposure on Monday.  He was referred here by the health department for prophylaxis.  He handled a kitten that tested positive for rabies on Monday.  He states he had an open sore on his right arm at the time though denies any bites or scratches.  Health department recommended he report to the ED for treatment with concern of possible saliva on the kitten.  He is asymptomatic.  The history is provided by the patient.    Past Medical History:  Diagnosis Date  . Acid reflux   . Allergy to alpha-gal   . Hyperlipidemia   . Hypertension   . Thyroid disease   . Tobacco abuse     Patient Active Problem List   Diagnosis Date Noted  . Essential hypertension 01/20/2015  . Tobacco abuse 01/20/2015  . Hyperlipidemia 01/20/2015  . Unstable angina (HCC) 01/19/2015    Past Surgical History:  Procedure Laterality Date  . CARDIAC CATHETERIZATION N/A 01/20/2015   Procedure: Left Heart Cath and Coronary Angiography;  Surgeon: Corky Crafts, MD;  Location: [facility] INVASIVE CV LAB;  Service: Cardiovascular;  Laterality: N/A;  . ELBOW SURGERY          Home Medications    Prior to Admission medications   Medication Sig Start Date End Date Taking? Authorizing Provider  atorvastatin (LIPITOR) 10 MG tablet Take 1 tablet by mouth at bedtime.  12/11/14   [provider]  levothyroxine (SYNTHROID, LEVOTHROID) 25 MCG tablet Take 25 mcg by mouth daily.    [provider]  metoprolol succinate (TOPROL-XL) 25 MG 24 hr tablet Take 1 tablet (25 mg total) by mouth at bedtime. 01/20/15   Dunn, Tacey Ruiz, PA-C    pantoprazole (PROTONIX) 40 MG tablet Take 40 mg by mouth daily.    [provider]    Family History No family history on file.  Social History Social History   Tobacco Use  . Smoking status: Current Every Day Smoker    Packs/day: 1.00  . Smokeless tobacco: Never Used  Substance Use Topics  . Alcohol use: No  . Drug use: No     Allergies   Other   Review of Systems Review of Systems  All other systems reviewed and are negative.    Physical Exam Updated Vital Signs BP (!) 135/96 (BP Location: Right Arm)   Pulse 79   Temp 97.7 F (36.5 C) (Oral)   Resp 16   Ht 5\' 11"  (1.803 m)   Wt 113.5 kg   SpO2 100%   BMI 34.89 kg/m   Physical Exam  Constitutional: He appears well-developed and well-nourished.  HENT:  Head: Normocephalic and atraumatic.  Eyes: Conjunctivae are normal.  Cardiovascular: Normal rate.  Pulmonary/Chest: Effort normal.  Skin:  Small 2 mm sized scab to the right dorsal forearm.  Nontender, no surrounding erythema.  No purulence.  Psychiatric: He has a normal mood and affect. His behavior is normal.  Nursing note and vitals reviewed.    ED Treatments / Results  Labs (all labs ordered are listed, but only abnormal results are displayed) Labs Reviewed - No  data to display  EKG None  Radiology No results found.  Procedures Procedures (including critical care time)  Medications Ordered in ED Medications  rabies vaccine (RABAVERT) injection 1 mL (has no administration in time range)  rabies immune globulin (HYPERAB/KEDRAB) injection 2,250 Units (has no administration in time range)     Initial Impression / Assessment and Plan / ED Course  I have reviewed the triage vital signs and the nursing notes.  Pertinent labs & imaging results that were available during my care of the patient were reviewed by me and considered in my medical decision making (see chart for details).     Patient with potential rabies exposure after  handling a kitten that tested positive for rabies.  Concerned because patient had open wound at that time though denies bites or scratches.  He is asymptomatic.  Prophylaxis administered in the ED.  Given instructions for follow-up vaccinations.  Discussed results, findings, treatment and follow up. Patient advised of return precautions. Patient verbalized understanding and agreed with plan.  Final Clinical Impressions(s) / ED Diagnoses   Final diagnoses:  Need for post exposure prophylaxis for rabies    ED Discharge Orders    None       Robinson, [place] N, PA-C 03/17/18 1206    Maia Plan, MD 03/17/18 [phone]

## 2018-03-17 NOTE — ED Triage Notes (Signed)
PT states held kitten that probably has rabies on Monday.  Did not get bit or scratched.  Health department referred pt here for rabies injections

## 2018-03-17 NOTE — ED Notes (Signed)
Patient is alert and orientedx4.  Patient was explained discharge instructions and they understood them with no questions.   

## 2018-03-20 ENCOUNTER — Encounter (HOSPITAL_COMMUNITY)
Admission: RE | Admit: 2018-03-20 | Discharge: 2018-03-20 | Disposition: A | Discharge status: Home / Self Care | Payer: BLUE CROSS/BLUE SHIELD | Source: Ambulatory Visit | Attending: Emergency Medicine | Admitting: Emergency Medicine

## 2018-03-20 DIAGNOSIS — Z203 Contact with and (suspected) exposure to rabies: Secondary | ICD-10-CM | POA: Diagnosis present

## 2018-03-20 MED ORDER — RABIES VACCINE, PCEC IM SUSR
1.0000 mL | Freq: Once | INTRAMUSCULAR | Status: AC
  Administered 2018-03-20: 1 mL via INTRAMUSCULAR
  Filled 2018-03-20: qty 1

## 2018-03-20 NOTE — Progress Notes (Signed)
States doing well, however does have soreness at sites of previous injections.  No s/s of infection.   Subsequent injections will be given in [facility] ER.  Patient provided with paper order and advised to take with him to next injection.

## 2018-03-24 ENCOUNTER — Ambulatory Visit (HOSPITAL_COMMUNITY)
Admission: RE | Admit: 2018-03-24 | Discharge: 2018-03-24 | Disposition: A | Discharge status: Home / Self Care | Payer: BLUE CROSS/BLUE SHIELD | Source: Ambulatory Visit | Attending: Emergency Medicine | Admitting: Emergency Medicine

## 2018-03-24 DIAGNOSIS — Z203 Contact with and (suspected) exposure to rabies: Secondary | ICD-10-CM | POA: Insufficient documentation

## 2018-03-24 MED ORDER — RABIES VACCINE, PCEC IM SUSR
1.0000 mL | Freq: Once | INTRAMUSCULAR | Status: AC
  Administered 2018-03-24: 1 mL via INTRAMUSCULAR
  Filled 2018-03-24: qty 1

## 2018-03-24 NOTE — Progress Notes (Signed)
Patient presented needing to get third rabies vaccination after exposure. Order placed, vaccine given in L deltoid. Patient has no concerns or complaints at this time. Patient verbalizes need for follow up vaccination. Patient to be discharged and ambulated out with staff at side.  Quita Skye, RN

## 2018-03-31 ENCOUNTER — Emergency Department (HOSPITAL_COMMUNITY)
Admission: EM | Admit: 2018-03-31 | Discharge: 2018-03-31 | Disposition: A | Discharge status: Home / Self Care | Payer: BLUE CROSS/BLUE SHIELD

## 2018-03-31 ENCOUNTER — Ambulatory Visit (HOSPITAL_COMMUNITY)
Admission: RE | Admit: 2018-03-31 | Discharge: 2018-03-31 | Disposition: A | Discharge status: Home / Self Care | Payer: BLUE CROSS/BLUE SHIELD | Source: Ambulatory Visit | Attending: Emergency Medicine | Admitting: Emergency Medicine

## 2018-03-31 DIAGNOSIS — Z23 Encounter for immunization: Secondary | ICD-10-CM | POA: Diagnosis not present

## 2018-03-31 DIAGNOSIS — Z203 Contact with and (suspected) exposure to rabies: Secondary | ICD-10-CM | POA: Insufficient documentation

## 2018-03-31 MED ORDER — RABIES VACCINE, PCEC IM SUSR
1.0000 mL | Freq: Once | INTRAMUSCULAR | Status: AC
  Administered 2018-03-31: 1 mL via INTRAMUSCULAR
  Filled 2018-03-31: qty 1

## 2018-03-31 NOTE — ED Notes (Signed)
Rabies vaccine given in the right deltoid.

## 2018-09-03 ENCOUNTER — Ambulatory Visit (INDEPENDENT_AMBULATORY_CARE_PROVIDER_SITE_OTHER): Payer: BLUE CROSS/BLUE SHIELD | Admitting: Otolaryngology

## 2018-09-03 DIAGNOSIS — J343 Hypertrophy of nasal turbinates: Secondary | ICD-10-CM

## 2018-09-03 DIAGNOSIS — J310 Chronic rhinitis: Secondary | ICD-10-CM | POA: Diagnosis not present

## 2018-09-03 DIAGNOSIS — J321 Chronic frontal sinusitis: Secondary | ICD-10-CM | POA: Diagnosis not present

## 2018-10-24 ENCOUNTER — Other Ambulatory Visit (HOSPITAL_COMMUNITY): Payer: Self-pay | Admitting: Otolaryngology

## 2018-10-24 ENCOUNTER — Other Ambulatory Visit: Payer: Self-pay | Admitting: Otolaryngology

## 2018-10-24 DIAGNOSIS — J320 Chronic maxillary sinusitis: Secondary | ICD-10-CM

## 2018-10-30 ENCOUNTER — Encounter (HOSPITAL_COMMUNITY): Payer: Self-pay

## 2018-10-30 ENCOUNTER — Ambulatory Visit (HOSPITAL_COMMUNITY): Payer: BLUE CROSS/BLUE SHIELD

## 2019-11-10 ENCOUNTER — Other Ambulatory Visit: Payer: Self-pay

## 2019-11-10 ENCOUNTER — Encounter (HOSPITAL_COMMUNITY): Payer: Self-pay | Admitting: Emergency Medicine

## 2019-11-10 ENCOUNTER — Emergency Department (HOSPITAL_COMMUNITY)
Admission: EM | Admit: 2019-11-10 | Discharge: 2019-11-11 | Disposition: A | Discharge status: Home / Self Care | Payer: BC Managed Care – PPO | Attending: Emergency Medicine | Admitting: Emergency Medicine

## 2019-11-10 ENCOUNTER — Emergency Department (HOSPITAL_COMMUNITY): Payer: BC Managed Care – PPO

## 2019-11-10 DIAGNOSIS — F17210 Nicotine dependence, cigarettes, uncomplicated: Secondary | ICD-10-CM | POA: Insufficient documentation

## 2019-11-10 DIAGNOSIS — R0789 Other chest pain: Secondary | ICD-10-CM | POA: Diagnosis present

## 2019-11-10 DIAGNOSIS — Z8616 Personal history of COVID-19: Secondary | ICD-10-CM | POA: Diagnosis not present

## 2019-11-10 DIAGNOSIS — I10 Essential (primary) hypertension: Secondary | ICD-10-CM | POA: Insufficient documentation

## 2019-11-10 LAB — COMPREHENSIVE METABOLIC PANEL
ALT: 107 U/L — ABNORMAL HIGH (ref 0–44)
AST: 48 U/L — ABNORMAL HIGH (ref 15–41)
Albumin: 3.6 g/dL (ref 3.5–5.0)
Alkaline Phosphatase: 67 U/L (ref 38–126)
Anion gap: 7 (ref 5–15)
BUN: 11 mg/dL (ref 6–20)
CO2: 24 mmol/L (ref 22–32)
Calcium: 8.5 mg/dL — ABNORMAL LOW (ref 8.9–10.3)
Chloride: 105 mmol/L (ref 98–111)
Creatinine, Ser: 1.08 mg/dL (ref 0.61–1.24)
GFR calc Af Amer: >60 mL/min (ref >60)
GFR calc non Af Amer: >60 mL/min (ref >60)
Glucose, Bld: 212 mg/dL — ABNORMAL HIGH (ref 70–99)
Potassium: 3.7 mmol/L (ref 3.5–5.1)
Sodium: 136 mmol/L (ref 135–145)
Total Bilirubin: 0.8 mg/dL (ref 0.3–1.2)
Total Protein: 6.5 g/dL (ref 6.5–8.1)

## 2019-11-10 LAB — CBC
HCT: 40.2 % (ref 39.0–52.0)
Hemoglobin: 13.6 g/dL (ref 13.0–17.0)
MCH: 32.2 pg (ref 26.0–34.0)
MCHC: 33.8 g/dL (ref 30.0–36.0)
MCV: 95 fL (ref 80.0–100.0)
Platelets: 151 10*3/uL (ref 150–400)
RBC: 4.23 MIL/uL (ref 4.22–5.81)
RDW: 13.2 % (ref 11.5–15.5)
WBC: 8.5 10*3/uL (ref 4.0–10.5)
nRBC: 0 % (ref 0.0–0.2)

## 2019-11-10 LAB — TROPONIN I (HIGH SENSITIVITY)
Troponin I (High Sensitivity): 4 ng/L (ref <18)
Troponin I (High Sensitivity): 5 ng/L (ref <18)

## 2019-11-10 LAB — D-DIMER, QUANTITATIVE: D-Dimer, Quant: 0.49 ug/mL-FEU (ref 0.00–0.50)

## 2019-11-10 MED ORDER — KETOROLAC TROMETHAMINE 30 MG/ML IJ SOLN
15.0000 mg | Freq: Once | INTRAMUSCULAR | Status: AC
  Administered 2019-11-10: 15 mg via INTRAVENOUS
  Filled 2019-11-10: qty 1

## 2019-11-10 MED ORDER — ASPIRIN EC 81 MG PO TBEC: 81.0000 mg | DELAYED_RELEASE_TABLET | Freq: Every day | ORAL | 2 refills | Status: AC

## 2019-11-10 MED ORDER — ACETAMINOPHEN 500 MG PO TABS
1000.0000 mg | ORAL_TABLET | Freq: Once | ORAL | Status: AC
  Administered 2019-11-10: 1000 mg via ORAL
  Filled 2019-11-10: qty 2

## 2019-11-10 NOTE — ED Provider Notes (Signed)
[facility] EMERGENCY DEPARTMENT Provider Note   CSN: [id number] Arrival date & time: 11/10/19  1927     History Chief Complaint  Patient presents with  . Chest Pain    Benjamin Anderson is a 54 y.o. male.  HPI      Benjamin Anderson is a 54 y.o. male, with a history of hyperlipidemia, HTN, presenting to the ED with chest pain with the first episode beginning around 12 PM today.  Patient states he was at rest when he began to feel sharp pain in the central chest, severe, lasting for 1 to 2 h, sometimes radiating to the back, spontaneously resolved. Current episode of chest pain began around 5:30 PM today, 4/10, central chest, nonradiating. He also notes left upper leg aching beginning this morning. Patient had a positive COVID-19 test on Oct 22, 2019.  States he never really had symptoms, however, he was tested because some of his coworkers had tested positive. Denies fever/chills, cough, shortness of breath, abdominal pain, dizziness, diaphoresis, lower extremity edema, or any other complaints.   Past Medical History:  Diagnosis Date  . Acid reflux   . Allergy to alpha-gal   . Hyperlipidemia   . Hypertension   . Thyroid disease   . Tobacco abuse     Patient Active Problem List   Diagnosis Date Noted  . Essential hypertension 01/20/2015  . Tobacco abuse 01/20/2015  . Hyperlipidemia 01/20/2015  . Unstable angina (HCC) 01/19/2015    Past Surgical History:  Procedure Laterality Date  . CARDIAC CATHETERIZATION N/A 01/20/2015   Procedure: Left Heart Cath and Coronary Angiography;  Surgeon: Corky Crafts, MD;  Location: [facility] INVASIVE CV LAB;  Service: Cardiovascular;  Laterality: N/A;  . CHOLECYSTECTOMY    . ELBOW SURGERY         History reviewed. No pertinent family history.  Social History   Tobacco Use  . Smoking status: Current Every Day Smoker    Packs/day: 1.00    Types: Cigarettes  . Smokeless tobacco: Never Used  Substance Use Topics  . Alcohol use: No   . Drug use: No    Home Medications Prior to Admission medications   Medication Sig Start Date End Date Taking? Authorizing Provider  famotidine (PEPCID) 40 MG tablet Take 40 mg by mouth daily. 10/31/19  Yes [provider]  fluconazole (DIFLUCAN) 150 MG tablet Take 150 mg by mouth daily. 11/07/19  Yes [provider]  metoprolol succinate (TOPROL-XL) 25 MG 24 hr tablet Take 1 tablet (25 mg total) by mouth at bedtime. 01/20/15  Yes Dunn, Dayna N, PA-C  rosuvastatin (CRESTOR) 10 MG tablet Take 10 mg by mouth at bedtime. 10/14/19  Yes [provider]  aspirin EC 81 MG tablet Take 1 tablet (81 mg total) by mouth daily. 11/10/19 02/08/20  Jeronimo Hellberg, Hillard Danker, PA-C    Allergies    Other  Review of Systems   Review of Systems  Constitutional: Negative for chills, diaphoresis and fever.  Respiratory: Negative for cough and shortness of breath.   Cardiovascular: Positive for chest pain. Negative for leg swelling.  Gastrointestinal: Negative for abdominal pain, diarrhea, nausea and vomiting.  Neurological: Negative for dizziness, syncope and weakness.  All other systems reviewed and are negative.   Physical Exam Updated Vital Signs BP (!) 147/82   Pulse 79   Temp 98.4 F (36.9 C)   Resp 17   Ht 5\' 11"  (1.803 m)   Wt 113.9 kg   SpO2 100%  BMI 35.01 kg/m   Physical Exam Vitals and nursing note reviewed.  Constitutional:      General: He is not in acute distress.    Appearance: He is well-developed. He is not diaphoretic.  HENT:     Head: Normocephalic and atraumatic.     Mouth/Throat:     Mouth: Mucous membranes are moist.     Pharynx: Oropharynx is clear.  Eyes:     Conjunctiva/sclera: Conjunctivae normal.  Cardiovascular:     Rate and Rhythm: Normal rate and regular rhythm.     Pulses: Normal pulses.          Radial pulses are 2+ on the right side and 2+ on the left side.       Posterior tibial pulses are 2+ on the right side and 2+ on the left side.      Heart sounds: Normal heart sounds.     Comments: Tactile temperature in the extremities appropriate and equal bilaterally. Pulmonary:     Effort: Pulmonary effort is normal. No respiratory distress.     Breath sounds: Normal breath sounds.  Abdominal:     Palpations: Abdomen is soft.     Tenderness: There is no abdominal tenderness. There is no guarding.  Musculoskeletal:     Cervical back: Neck supple.     Right lower leg: No edema.     Left lower leg: No edema.     Comments: Patient indicates tenderness to the left posterior thigh without noted erythema, swelling, increased warmth.  No tenderness or other abnormalities to the calves bilaterally.  Lymphadenopathy:     Cervical: No cervical adenopathy.  Skin:    General: Skin is warm and dry.  Neurological:     Mental Status: He is alert.     Comments: Sensation grossly intact to light touch in the lower extremities bilaterally. No saddle anesthesias. Strength 5/5 in the bilateral lower extremities. No noted gait deficit.   Psychiatric:        Mood and Affect: Mood and affect normal.        Speech: Speech normal.        Behavior: Behavior normal.     ED Results / Procedures / Treatments   Labs (all labs ordered are listed, but only abnormal results are displayed) Labs Reviewed  COMPREHENSIVE METABOLIC PANEL - Abnormal; Notable for the following components:      Result Value   Glucose, Bld 212 (*)    Calcium 8.5 (*)    AST 48 (*)    ALT 107 (*)    All other components within normal limits  CBC  D-DIMER, QUANTITATIVE (NOT AT [facility])  TROPONIN I (HIGH SENSITIVITY)  TROPONIN I (HIGH SENSITIVITY)    EKG None  Radiology DG Chest 2 View  Result Date: 11/10/2019 CLINICAL DATA:  chest pain and left upper leg and groin pain that goes down to his knee onset this AM. Positive for COVID 5/11. Hx of HTN, cardiac cath. Stated chronic sob, no worse and nasal congestion, no cough. CP EXAM: CHEST - 2 VIEW COMPARISON:  None. FINDINGS:  Normal mediastinum and cardiac silhouette. Mild peribronchial thickening. No evidence of effusion, infiltrate, or pneumothorax. No acute bony abnormality. IMPRESSION: Mild bronchitis or bronchiolitis pattern. Electronically Signed   By: Genevive Bi M.D.   On: 11/10/2019 20:14    Procedures Procedures (including critical care time)  Medications Ordered in ED Medications  acetaminophen (TYLENOL) tablet 1,000 mg (1,000 mg Oral Given 11/10/19 2031)  ketorolac (TORADOL) 30  MG/ML injection 15 mg (15 mg Intravenous Given 11/10/19 2130)    ED Course  I have reviewed the triage vital signs and the nursing notes.  Pertinent labs & imaging results that were available during my care of the patient were reviewed by me and considered in my medical decision making (see chart for details).  Clinical Course as of Nov 10 2247  Sun Nov 10, 2019  2029 Mild elevation.  This type of finding has been documented in patients with COVID-19.  AST(!): 48 [SJ]  2247 Patient has no cough or respiratory symptoms.  DG Chest 2 View [SJ]    Clinical Course User Index [SJ] Lalo Tromp, Helane Gunther, PA-C   MDM Rules/Calculators/A&P                      Patient presents complaining of a couple episodes of chest pain.  Description of pain sounds atypical for ACS.  No accompanying symptoms.  Low suspicion for ACS. No high risk/suspicious features; no exertional chest pain, vomiting, diaphoresis, or radiation. HEART score is 2, indicating low risk for a cardiac event.  EKG without evidence of acute ischemia or pathologic/symptomatic arrhythmia. Wells criteria score is 0, indicating low risk for PE.  D-dimer negative. Dissection was considered, but thought less likely base on: History and description of the pain are not suggestive, patient is not ill-appearing, lack of risk factors, equal bilateral pulses, lack of neurologic deficits, no widened mediastinum on chest x-ray. Patient to follow-up with cardiology for further  assessment. I reviewed and interpreted the patient's labs and radiological studies.  Duplex ultrasound to assess for DVT unavailable at this facility at this time.  Order was placed for patient return to have the study completed.  Due to patient's overall low risk for DVT as well as his negative D-dimer, anticoagulation was not started.  End of shift patient care handoff report given to Evalee Jefferson, PA-C. Plan: Patient awaiting second troponin. If negative, discharge patient.   Findings and plan of care discussed with Trish Mage, MD.   Vitals:   11/10/19 1938 11/10/19 2130 11/10/19 2200  BP: (!) 147/82 (!) 142/83 137/85  Pulse: 79 64 63  Resp: 17 16   Temp: 98.4 F (36.9 C)    SpO2: 100% 100% 99%  Weight: 113.9 kg    Height: 5\' 11"  (1.803 m)       Final Clinical Impression(s) / ED Diagnoses Final diagnoses:  Atypical chest pain    Rx / DC Orders ED Discharge Orders         Ordered    US Venous Img Lower Unilateral Left     11/10/19 2244    aspirin EC 81 MG tablet  Daily     11/10/19 [address], PA-C 11/10/19 2255    Veryl Speak, MD 11/10/19 2316

## 2019-11-10 NOTE — ED Triage Notes (Signed)
Pt from home via pov for chest pain and left upper leg and groin pain that goes down to his knee. He wants to make sure he doesn't have a blood clot. Pain started this am.    Also wanted staff to know that he was positive for COVID 5/11.

## 2019-11-10 NOTE — Discharge Instructions (Addendum)
Your work-up was reassuring today. Follow-up with your primary care provider as well as cardiology on this matter.  Call to make an appointment with cardiology.  Start taking an 81 mg aspirin daily if you are not already doing so.  An order has been placed for a DVT study to assess your leg for blood clot.  Follow the instructions on this print out to set this up.  Return to the emergency department for persistent chest pain, shortness of breath, abdominal pain, dizziness, passing out, or any other major concerns.

## 2020-02-27 ENCOUNTER — Ambulatory Visit: Payer: BC Managed Care – PPO | Admitting: Cardiology

## 2022-04-24 IMAGING — DX DG CHEST 2V
2 series · 2 of 2 positions shown · non-contrast
Comparison: None.

CLINICAL DATA: chest pain and left upper leg and groin pain that
goes down to his knee onset this AM. Positive for COVID [DATE]. Hx of
HTN, cardiac cath. Stated chronic sob, no worse and nasal
congestion, no cough. CP

EXAM:
CHEST - 2 VIEW

[chest pa]
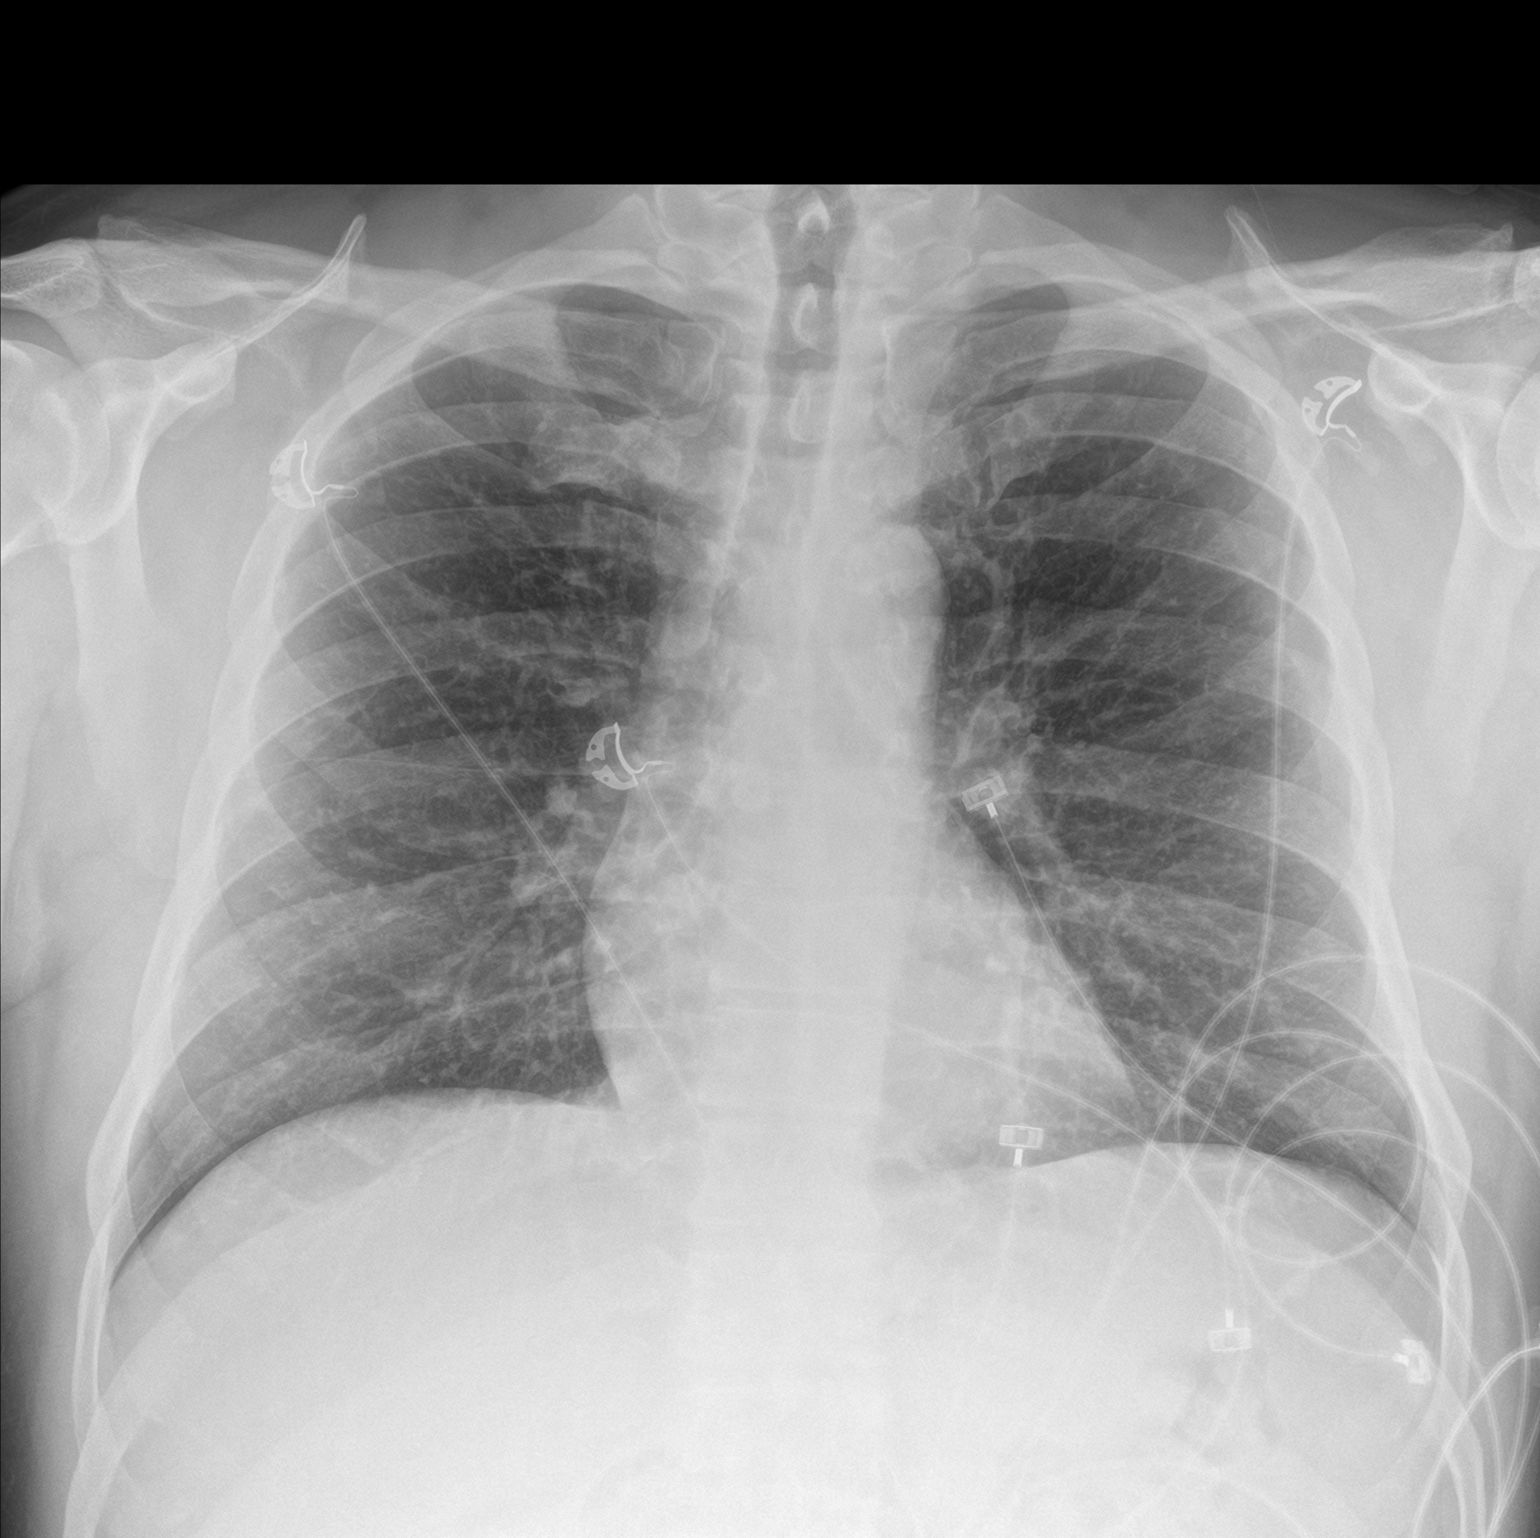

[chest lat]
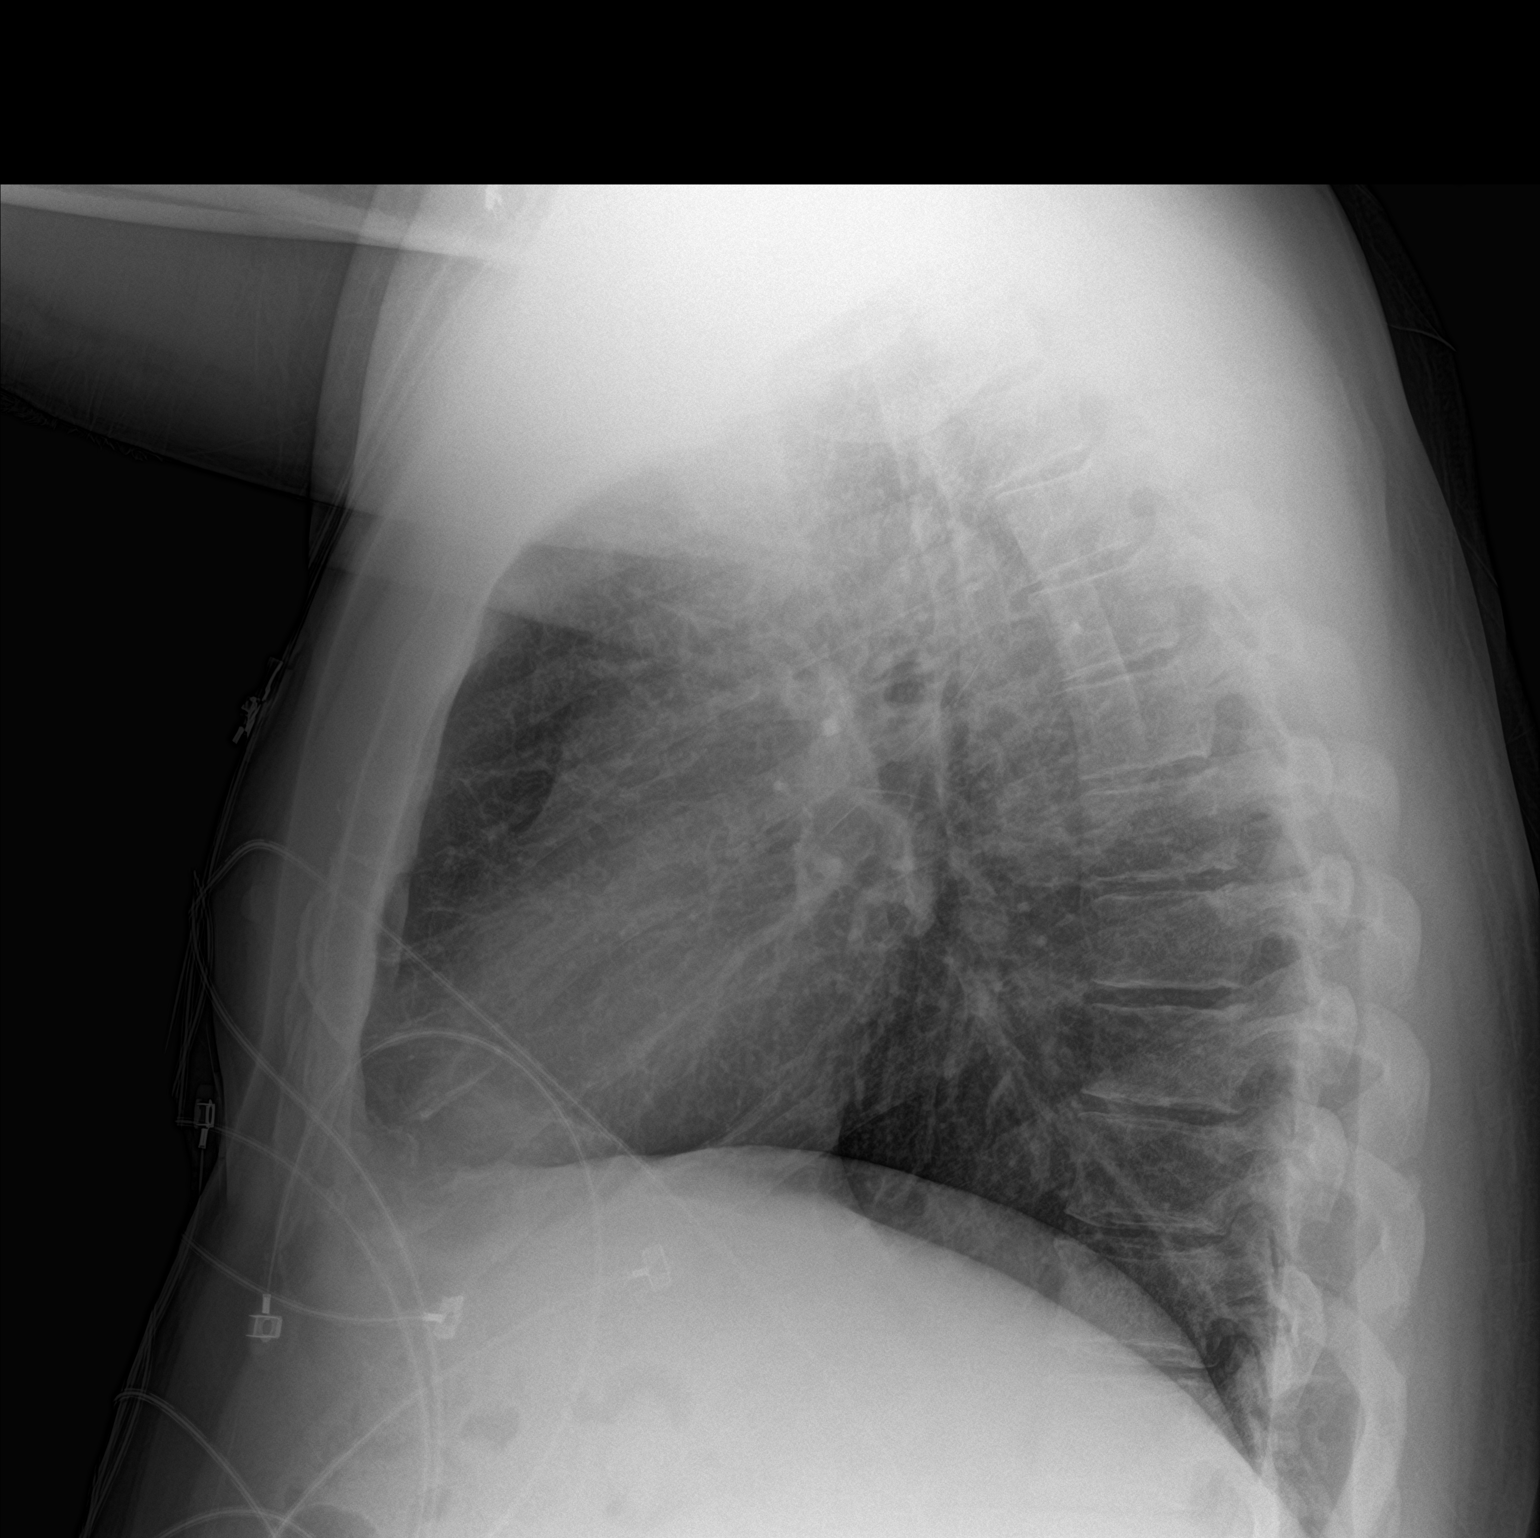

[2 of 2 positions shown; findings below may reference images not displayed]

FINDINGS: Normal mediastinum and cardiac silhouette. Mild peribronchial
thickening. No evidence of effusion, infiltrate, or pneumothorax.
No acute bony abnormality.
IMPRESSION: Mild bronchitis or bronchiolitis pattern.

## 2022-11-03 ENCOUNTER — Emergency Department (HOSPITAL_COMMUNITY)
Admission: EM | Admit: 2022-11-03 | Discharge: 2022-11-03 | Disposition: A | Discharge status: Home / Self Care | Payer: BC Managed Care – PPO | Attending: Emergency Medicine | Admitting: Emergency Medicine

## 2022-11-03 ENCOUNTER — Other Ambulatory Visit: Payer: Self-pay

## 2022-11-03 ENCOUNTER — Encounter (HOSPITAL_COMMUNITY): Payer: Self-pay | Admitting: Emergency Medicine

## 2022-11-03 DIAGNOSIS — W57XXXA Bitten or stung by nonvenomous insect and other nonvenomous arthropods, initial encounter: Secondary | ICD-10-CM | POA: Diagnosis not present

## 2022-11-03 DIAGNOSIS — Z1152 Encounter for screening for COVID-19: Secondary | ICD-10-CM | POA: Diagnosis not present

## 2022-11-03 DIAGNOSIS — S80861A Insect bite (nonvenomous), right lower leg, initial encounter: Secondary | ICD-10-CM | POA: Diagnosis present

## 2022-11-03 DIAGNOSIS — R519 Headache, unspecified: Secondary | ICD-10-CM

## 2022-11-03 DIAGNOSIS — S81831A Puncture wound without foreign body, right lower leg, initial encounter: Secondary | ICD-10-CM | POA: Insufficient documentation

## 2022-11-03 LAB — SARS CORONAVIRUS 2 BY RT PCR: SARS Coronavirus 2 by RT PCR: NEGATIVE

## 2022-11-03 MED ORDER — DOXYCYCLINE HYCLATE 100 MG PO TABS
100.0000 mg | ORAL_TABLET | Freq: Once | ORAL | Status: AC
  Administered 2022-11-03: 100 mg via ORAL
  Filled 2022-11-03: qty 1

## 2022-11-03 MED ORDER — IBUPROFEN 800 MG PO TABS
800.0000 mg | ORAL_TABLET | Freq: Once | ORAL | Status: AC
  Administered 2022-11-03: 800 mg via ORAL
  Filled 2022-11-03: qty 1

## 2022-11-03 MED ORDER — DOXYCYCLINE HYCLATE 100 MG PO CAPS: 100.0000 mg | ORAL_CAPSULE | Freq: Two times a day (BID) | ORAL | 0 refills | Status: AC

## 2022-11-03 NOTE — ED Provider Notes (Signed)
[place] EMERGENCY DEPARTMENT AT [facility] Provider Note   CSN: [id number] Arrival date & time: 11/03/22  2216     History  Chief Complaint  Patient presents with   Headache    Benjamin Anderson is a 57 y.o. male.  Patient is a 57 year old male with history of pretension, hyperlipidemia, and GERD.  Patient presenting today with complaints of headache and bodyaches for the past several days.  He reports some cough, but no shortness of breath.  No fevers at home.  He denies any ill contacts.  He does report a tick bite several weeks ago.  He removed a tick off of his lower leg, but still has a puncture wound with surrounding mild erythema.  He denies any other rash.  No aggravating or alleviating factors.  The history is provided by the patient.       Home Medications Prior to Admission medications   Medication Sig Start Date End Date Taking? Authorizing Provider  famotidine (PEPCID) 40 MG tablet Take 40 mg by mouth daily. 10/31/19   [provider]  fluconazole (DIFLUCAN) 150 MG tablet Take 150 mg by mouth daily. 11/07/19   [provider]  metoprolol succinate (TOPROL-XL) 25 MG 24 hr tablet Take 1 tablet (25 mg total) by mouth at bedtime. 01/20/15   Dunn, Tacey Ruiz, PA-C  rosuvastatin (CRESTOR) 10 MG tablet Take 10 mg by mouth at bedtime. 10/14/19   [provider]      Allergies    Other    Review of Systems   Review of Systems  All other systems reviewed and are negative.   Physical Exam Updated Vital Signs BP (!) 140/87 (BP Location: Right Arm)   Pulse 69   Temp 98.2 F (36.8 C) (Oral)   Resp 18   Ht 5\' 11"  (1.803 m)   Wt 113.4 kg   SpO2 97%   BMI 34.87 kg/m  Physical Exam Vitals and nursing note reviewed.  Constitutional:      General: He is not in acute distress.    Appearance: He is well-developed. He is not diaphoretic.  HENT:     Head: Normocephalic and atraumatic.  Cardiovascular:     Rate and Rhythm: Normal rate and  regular rhythm.     Heart sounds: No murmur heard.    No friction rub.  Pulmonary:     Effort: Pulmonary effort is normal. No respiratory distress.     Breath sounds: Normal breath sounds. No wheezing or rales.  Abdominal:     General: Bowel sounds are normal. There is no distension.     Palpations: Abdomen is soft.     Tenderness: There is no abdominal tenderness.  Musculoskeletal:        General: Normal range of motion.     Cervical back: Normal range of motion and neck supple.  Skin:    General: Skin is warm and dry.     Comments: There is a small puncture wound noted to the right lower extremity.  There is mild surrounding erythema, but no significant rash.  Neurological:     Mental Status: He is alert and oriented to person, place, and time.     Coordination: Coordination normal.     ED Results / Procedures / Treatments   Labs (all labs ordered are listed, but only abnormal results are displayed) Labs Reviewed  SARS CORONAVIRUS 2 BY RT PCR    EKG None  Radiology No results found.  Procedures Procedures  Medications Ordered in ED Medications  ibuprofen (ADVIL) tablet 800 mg (has no administration in time range)    ED Course/ Medical Decision Making/ A&P  Patient is a 57 year old male presenting with complaints of headache and bodyaches for the past several days in the setting of a tick bite 3 weeks ago.  Patient arrives here with stable vital signs and is clinically well-appearing.  Physical examination basically unremarkable.  He does have a small puncture to his lower leg with mild surrounding erythema, but no bull's-eye appearing rash.  Patient's COVID test is negative.  Due to the tick bite, patient will be treated with doxycycline and is to follow-up with primary doctor if not improving.  Final Clinical Impression(s) / ED Diagnoses Final diagnoses:  None    Rx / DC Orders ED Discharge Orders     None         Geoffery Lyons, MD 11/03/22  2319

## 2022-11-03 NOTE — ED Triage Notes (Signed)
Pt via POV c/o anterior headache since yesterday with fatigue and body aches, runny nose, and sore throat today as well. Pt notes he removed a tick from his leg about 3 weeks ago and has a small hole in his left shin where the tick was removed. No bulls eye noted around tick bite. No known sick contacts.

## 2022-11-03 NOTE — Discharge Instructions (Signed)
Begin taking doxycycline as prescribed.  Take ibuprofen 600 mg every 6 hours as needed for pain or fever.  Return to the emergency department if symptoms worsen or change.

## 2023-02-16 ENCOUNTER — Ambulatory Visit: Payer: BC Managed Care – PPO | Admitting: Internal Medicine
